# Patient Record
Sex: Female | Born: 2000 | Race: White | Hispanic: No | Marital: Single | Smoking: Never smoker
Health system: Southern US, Community
[De-identification: ages and names within clinical notes are randomized; demographics above are authoritative.]

## PROBLEM LIST (undated history)

## (undated) DIAGNOSIS — F419 Anxiety disorder, unspecified: Secondary | ICD-10-CM

## (undated) DIAGNOSIS — R7303 Prediabetes: Secondary | ICD-10-CM

## (undated) DIAGNOSIS — Z8669 Personal history of other diseases of the nervous system and sense organs: Secondary | ICD-10-CM

## (undated) DIAGNOSIS — F32A Depression, unspecified: Secondary | ICD-10-CM

## (undated) DIAGNOSIS — T7840XA Allergy, unspecified, initial encounter: Secondary | ICD-10-CM

## (undated) HISTORY — DX: Prediabetes: R73.03

## (undated) HISTORY — DX: Allergy, unspecified, initial encounter: T78.40XA

## (undated) HISTORY — DX: Anxiety disorder, unspecified: F41.9

## (undated) HISTORY — DX: Depression, unspecified: F32.A

## (undated) HISTORY — DX: Personal history of other diseases of the nervous system and sense organs: Z86.69

---
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
## 2022-02-18 ENCOUNTER — Telehealth: Payer: Managed Care, Other (non HMO)

## 2022-02-24 ENCOUNTER — Encounter: Payer: Self-pay | Admitting: Physician Assistant

## 2022-02-24 ENCOUNTER — Ambulatory Visit (INDEPENDENT_AMBULATORY_CARE_PROVIDER_SITE_OTHER): Payer: Managed Care, Other (non HMO) | Admitting: Physician Assistant

## 2022-02-24 VITALS — BP 128/86 | HR 100 | Temp 98.0°F | Ht 60.24 in | Wt 171.2 lb

## 2022-02-24 DIAGNOSIS — F419 Anxiety disorder, unspecified: Secondary | ICD-10-CM | POA: Diagnosis not present

## 2022-02-24 DIAGNOSIS — R7303 Prediabetes: Secondary | ICD-10-CM | POA: Insufficient documentation

## 2022-02-24 DIAGNOSIS — N920 Excessive and frequent menstruation with regular cycle: Secondary | ICD-10-CM | POA: Diagnosis not present

## 2022-02-24 DIAGNOSIS — F32A Depression, unspecified: Secondary | ICD-10-CM

## 2022-02-24 MED ORDER — HYDROXYZINE HCL 10 MG PO TABS: 10.0000 mg | ORAL_TABLET | Freq: Three times a day (TID) | ORAL | 0 refills | Status: DC | PRN

## 2022-02-24 NOTE — Assessment & Plan Note (Signed)
Most recent A1c last month was 5.7%.  She is working with her gynecologist.  Also gave her the name of nutritionist in town to reach out to.

## 2022-02-24 NOTE — Progress Notes (Signed)
Subjective:    Patient ID: Cindy Pope, female    DOB: 2001/03/26, 21 y.o.   MRN: [id number]  Chief Complaint  Patient presents with   New Patient (Initial Visit)    NP in office to establish care with PCP; pt has been having PCOS symptoms and was getting blood work completed, has first Pap scheduled in a few weeks; been having a really hard time with anxiety and depression, having panic attacks daily for past few months and starting to effect her not wanting to leave her home.     HPI 21 y.o. patient presents today for new patient establishment with me.  Patient was previously established with pediatrician.  Current Care Team: GYN - [place] Laws    Acute Concerns: Anxiety / depression worse - tried Better Help therapy, didn't seem to help much. 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 Middle school issues being bullied, transferred to a smaller school early college, graduated with 10 people- did better there.   Not sleeping well. Face gets warm and flushed often from anxiety. Uses a fan at night. Tosses and turns, restless, up every few hours. Tried Melatonin and some OTC vitamins, but this hasn't helped.  Chronic Concerns: See PMH listed below, as well as A/P for details on issues we specifically discussed during today's visit.      Past Medical History:  Diagnosis Date   Allergy Sep. 11th 2023   Caffine, break into hives and sweats if i drink to much of it.   Anxiety    Depression    Hx of migraines    Prediabetes     History reviewed. No pertinent surgical history.  Family History  Problem Relation Age of Onset   Anxiety disorder Mother    Depression Mother    Obesity Mother    Stroke Mother    Hypertension Mother    Anxiety disorder Father    Depression Father    Obesity Father    Diabetes Paternal Uncle    Diabetes Maternal Grandmother    Obesity Maternal Grandmother    Hearing loss Maternal  Grandfather    Obesity Maternal Grandfather    Cancer Maternal Grandfather    Diabetes Paternal Grandmother    Hearing loss Paternal Grandmother    Obesity Paternal Grandmother    Diabetes Paternal Grandfather    Hearing loss Paternal Grandfather    Obesity Paternal Grandfather     Social History   Tobacco Use   Smoking status: Never   Smokeless tobacco: Never  Vaping Use   Vaping Use: Former   Quit date: 12/10/2021  Substance Use Topics   Alcohol use: No   Drug use: Never     No Known Allergies  Review of Systems NEGATIVE UNLESS OTHERWISE INDICATED IN HPI      Objective:     BP 128/86 (BP Location: Right Arm)   Pulse 100   Temp 98 F (36.7 C) (Temporal)   Ht 5' 0.24" (1.53 m)   Wt 171 lb 3.2 oz (77.7 kg)   LMP 01/23/2022 (Exact Date)   SpO2 94%   BMI 33.17 kg/m   Wt Readings from Last 3 Encounters:  02/24/22 171 lb 3.2 oz (77.7 kg)    BP Readings from Last 3 Encounters:  02/24/22 128/86     Physical Exam Vitals and nursing note reviewed.  Constitutional:      Appearance: Normal appearance. She is obese. She is not toxic-appearing.  HENT:     Head: Normocephalic and atraumatic.  Eyes:     Extraocular Movements: Extraocular movements intact.     Conjunctiva/sclera: Conjunctivae normal.     Pupils: Pupils are equal, round, and reactive to light.  Cardiovascular:     Rate and Rhythm: Normal rate and regular rhythm.     Pulses: Normal pulses.     Heart sounds: Normal heart sounds.  Pulmonary:     Effort: Pulmonary effort is normal.     Breath sounds: Normal breath sounds.  Musculoskeletal:        General: Normal range of motion.     Cervical back: Normal range of motion and neck supple.  Skin:    General: Skin is warm and dry.  Neurological:     General: No focal deficit present.     Mental Status: She is alert and oriented to person, place, and time.  Psychiatric:        Mood and Affect: Mood is anxious. Affect is tearful.        Behavior:  Behavior normal.        Assessment & Plan:  Anxiety and depression Assessment & Plan:      02/24/2022    9:34 AM  PHQ9 SCORE ONLY  PHQ-9 Total Score 6    Worse this year after experiencing a break-up from a 4-year engagement.  I do think she would benefit from counseling at this time.  Referral was placed to see Dr. Carolynne Edouard at our office.  I will also have her start trying hydroxyzine for sleep and as needed for panic.  Possible side effects discussed with her.  I do think that the quality of sleep is really important for her and she is lacking in this right now.  Close follow-up with me in the next few weeks to see how she is doing.   Orders: -     Ambulatory referral to Psychology  Prediabetes Assessment & Plan: Most recent A1c last month was 5.7%.  She is working with her gynecologist.  Also gave her the name of nutritionist in town to reach out to.   Menorrhagia with regular cycle Assessment & Plan: Currently working with her gynecologist.   Other orders -     hydrOXYzine HCl; Take 1 tablet (10 mg total) by mouth 3 (three) times daily as needed for anxiety.  Dispense: 30 tablet; Refill: 0       Return in about 2 weeks (around 03/10/2022) for recheck.    Imre Vecchione M Michaelann Gunnoe, PA-C

## 2022-02-24 NOTE — Assessment & Plan Note (Signed)
      02/24/2022    9:34 AM  PHQ9 SCORE ONLY  PHQ-9 Total Score 6    Worse this year after experiencing a break-up from a 4-year engagement.  I do think she would benefit from counseling at this time.  Referral was placed to see Dr. Carolynne Edouard at our office.  I will also have her start trying hydroxyzine for sleep and as needed for panic.  Possible side effects discussed with her.  I do think that the quality of sleep is really important for her and she is lacking in this right now.  Close follow-up with me in the next few weeks to see how she is doing.

## 2022-02-24 NOTE — Patient Instructions (Addendum)
Welcome to [facility]! It was a pleasure meeting you today.  As discussed, Please schedule a 2 week follow up visit today.   Referral sent to Dr. Theodis Shove for counseling at our office. Call [phone] to schedule.  Try hydroxyzine tablets for anxiety / sleep. May take 1-2 tablets about 30-60 minutes prior to sleep. You may also take one tablet during the day for acute panic - caution drowsiness.   Please work on the following to help with sleep:  -Sleep only long enough to feel rested then get out of bed -Go to bed and get up at the same time every day. -Do not try to force yourself to sleep. If you can't sleep, get out of bed adn try again later. -Have coffee, tea, and other foods that have caffeine only in the morning. -Avoid alcohol -Keep your bedroom dark, cool, quiet, and free of reminders of work or other things that cause you stress -Exercise several days a week, but not right before bed -Avoid looking at phones or reading devices ("e-books") that give off light before bed. This can make it harder to fall asleep   PLEASE NOTE:  If you had any LAB tests please let us know if you have not heard back within a few days. You may see your results on MyChart before we have a chance to review them but we will give you a call once they are reviewed by [place]. If we ordered any REFERRALS today, please let us know if you have not heard from their office within the next two weeks. Let us know through MyChart if you are needing REFILLS, or have your pharmacy send [place] the request. You can also use MyChart to communicate with me or any office staff.  Please try these tips to maintain a healthy lifestyle:  Eat most of your calories during the day when you are active. Eliminate processed foods including packaged sweets (pies, cakes, cookies), reduce intake of potatoes, white bread, white pasta, and white rice. Look for whole grain options, oat flour or almond  flour.  Each meal should contain half fruits/vegetables, one quarter protein, and one quarter carbs (no bigger than a computer mouse).  Cut down on sweet beverages. This includes juice, soda, and sweet tea. Also watch fruit intake, though this is a healthier sweet option, it still contains natural sugar! Limit to 3 servings daily.  Drink at least 1 glass of water with each meal and aim for at least 8 glasses (64 ounces) per day.  Exercise at least 150 minutes every week to the best of your ability.    Take Care,  Lenor Provencher, PA-C

## 2022-02-24 NOTE — Assessment & Plan Note (Signed)
Currently working with her gynecologist.

## 2022-03-05 ENCOUNTER — Encounter: Payer: Self-pay | Admitting: Physician Assistant

## 2022-03-10 ENCOUNTER — Encounter: Payer: Self-pay | Admitting: Physician Assistant

## 2022-03-10 ENCOUNTER — Ambulatory Visit (INDEPENDENT_AMBULATORY_CARE_PROVIDER_SITE_OTHER): Payer: Managed Care, Other (non HMO) | Admitting: Physician Assistant

## 2022-03-10 VITALS — BP 115/75 | HR 82 | Temp 97.5°F | Ht 60.0 in | Wt 176.8 lb

## 2022-03-10 DIAGNOSIS — F32A Depression, unspecified: Secondary | ICD-10-CM

## 2022-03-10 DIAGNOSIS — F419 Anxiety disorder, unspecified: Secondary | ICD-10-CM

## 2022-03-10 DIAGNOSIS — E282 Polycystic ovarian syndrome: Secondary | ICD-10-CM | POA: Diagnosis not present

## 2022-03-10 MED ORDER — BUSPIRONE HCL 7.5 MG PO TABS: 7.5000 mg | ORAL_TABLET | Freq: Two times a day (BID) | ORAL | 0 refills | Status: AC

## 2022-03-10 MED ORDER — HYDROXYZINE HCL 10 MG PO TABS: 10.0000 mg | ORAL_TABLET | Freq: Every evening | ORAL | 2 refills | Status: DC | PRN

## 2022-03-10 NOTE — Progress Notes (Signed)
Subjective:    Patient ID: Cindy Pope, female    DOB: Sep 06, 2000, 21 y.o.   MRN: [id number]  Chief Complaint  Patient presents with   Follow-up    Pt in for 2 wk f/u for anxiety and depression; pt wanting to ensure result from ultrasound was received.     HPI Patient is in today for 2 week f/up. See A/P for details.   Past Medical History:  Diagnosis Date   Allergy Sep. 11th 2023   Caffine, break into hives and sweats if i drink to much of it.   Anxiety    Depression    Hx of migraines    Prediabetes     No past surgical history on file.  Family History  Problem Relation Age of Onset   Anxiety disorder Mother    Depression Mother    Obesity Mother    Stroke Mother    Hypertension Mother    Anxiety disorder Father    Depression Father    Obesity Father    Diabetes Paternal Uncle    Diabetes Maternal Grandmother    Obesity Maternal Grandmother    Hearing loss Maternal Grandfather    Obesity Maternal Grandfather    Cancer Maternal Grandfather    Diabetes Paternal Grandmother    Hearing loss Paternal Grandmother    Obesity Paternal Grandmother    Diabetes Paternal Grandfather    Hearing loss Paternal Grandfather    Obesity Paternal Grandfather     Social History   Tobacco Use   Smoking status: Never   Smokeless tobacco: Never  Vaping Use   Vaping Use: Former   Quit date: 12/10/2021  Substance Use Topics   Alcohol use: No   Drug use: Never     No Known Allergies  Review of Systems NEGATIVE UNLESS OTHERWISE INDICATED IN HPI      Objective:     BP 115/75 (BP Location: Left Arm)   Pulse 82   Temp (!) 97.5 F (36.4 C) (Temporal)   Ht 5' (1.524 m)   Wt 176 lb 12.8 oz (80.2 kg)   LMP 02/22/2022   SpO2 95%   BMI 34.53 kg/m   Wt Readings from Last 3 Encounters:  03/10/22 176 lb 12.8 oz (80.2 kg)  02/24/22 171 lb 3.2 oz (77.7 kg)    BP Readings from Last 3 Encounters:  03/10/22 115/75  02/24/22 128/86     Physical Exam Vitals and  nursing note reviewed.  Constitutional:      Appearance: Normal appearance. She is obese. She is not toxic-appearing.  HENT:     Head: Normocephalic and atraumatic.  Eyes:     Extraocular Movements: Extraocular movements intact.     Conjunctiva/sclera: Conjunctivae normal.     Pupils: Pupils are equal, round, and reactive to light.  Cardiovascular:     Rate and Rhythm: Normal rate and regular rhythm.     Pulses: Normal pulses.     Heart sounds: Normal heart sounds.  Pulmonary:     Effort: Pulmonary effort is normal.     Breath sounds: Normal breath sounds.  Musculoskeletal:        General: Normal range of motion.     Cervical back: Normal range of motion and neck supple.  Skin:    General: Skin is warm and dry.  Neurological:     General: No focal deficit present.     Mental Status: She is alert and oriented to person, place, and time.  Psychiatric:  Mood and Affect: Mood and affect normal. Mood is not anxious. Affect is not tearful.        Assessment & Plan:  PCOS (polycystic ovarian syndrome) Assessment & Plan: New diagnosis 02/2022 with [facility] Started on Metformin 500 mg BID and OCPs with [facility]  Looked into nutritionist in [place], but she was too costly and didn't take insurance [place] new referral to nutritionist     Anxiety and depression Assessment & Plan: Sleeping better when using hydroxyzine for sleep; made her too drowsy when taking as needed for panic. Still getting anxious when going out with friends / out in a crowd. First appt with Dr. Theodis Shove 12/13. May try buspar 7.5 mg BID if desired to help with daytime anxiousness. Pt aware of risks vs benefits and possible adverse reactions. She might hold off until first appt with [facility]; will let me know what she decides.  Overall seeing improvement and this is great news    Other orders -     busPIRone HCl; Take 1 tablet (7.5 mg total) by mouth 2 (two) times daily.  Dispense: 60 tablet; Refill:  0 -     hydrOXYzine HCl; Take 1 tablet (10 mg total) by mouth at bedtime as needed for anxiety.  Dispense: 30 tablet; Refill: 2       Return in about 3 months (around 06/10/2022) for med recheck .  This note was prepared with assistance of Systems analyst. Occasional wrong-word or sound-a-like substitutions may have occurred due to the inherent limitations of voice recognition software.     Cordera Stineman M Jemmie Rhinehart, PA-C

## 2022-03-10 NOTE — Assessment & Plan Note (Addendum)
New diagnosis 02/2022 with [facility] Started on Metformin 500 mg BID and OCPs with [facility]  Looked into nutritionist in [place], but she was too costly and didn't take insurance [facility] new referral to nutritionist

## 2022-03-10 NOTE — Assessment & Plan Note (Signed)
Sleeping better when using hydroxyzine for sleep; made her too drowsy when taking as needed for panic. Still getting anxious when going out with friends / out in a crowd. First appt with Dr. Theodis Shove 12/13. May try buspar 7.5 mg BID if desired to help with daytime anxiousness. Pt aware of risks vs benefits and possible adverse reactions. She might hold off until first appt with [facility]; will let me know what she decides.  Overall seeing improvement and this is great news

## 2022-04-01 ENCOUNTER — Encounter: Payer: Self-pay | Admitting: Internal Medicine

## 2022-04-01 ENCOUNTER — Ambulatory Visit (INDEPENDENT_AMBULATORY_CARE_PROVIDER_SITE_OTHER): Payer: Managed Care, Other (non HMO) | Admitting: Internal Medicine

## 2022-04-01 VITALS — BP 132/87 | HR 96 | Temp 98.2°F | Resp 14 | Ht 60.0 in | Wt 168.6 lb

## 2022-04-01 DIAGNOSIS — J45909 Unspecified asthma, uncomplicated: Secondary | ICD-10-CM | POA: Diagnosis not present

## 2022-04-01 DIAGNOSIS — F32A Depression, unspecified: Secondary | ICD-10-CM

## 2022-04-01 DIAGNOSIS — F419 Anxiety disorder, unspecified: Secondary | ICD-10-CM

## 2022-04-01 DIAGNOSIS — E669 Obesity, unspecified: Secondary | ICD-10-CM

## 2022-04-01 DIAGNOSIS — T50905A Adverse effect of unspecified drugs, medicaments and biological substances, initial encounter: Secondary | ICD-10-CM

## 2022-04-01 HISTORY — DX: Unspecified asthma, uncomplicated: J45.909

## 2022-04-01 MED ORDER — SEMAGLUTIDE-WEIGHT MANAGEMENT 1.7 MG/0.75ML ~~LOC~~ SOAJ: 1.7000 mg | SUBCUTANEOUS | 0 refills | Status: DC

## 2022-04-01 MED ORDER — SEMAGLUTIDE-WEIGHT MANAGEMENT 0.5 MG/0.5ML ~~LOC~~ SOAJ: 0.5000 mg | SUBCUTANEOUS | 0 refills | Status: DC

## 2022-04-01 MED ORDER — FLUOXETINE HCL 20 MG PO TABS: 20.0000 mg | ORAL_TABLET | Freq: Every day | ORAL | 3 refills | Status: DC

## 2022-04-01 MED ORDER — SEMAGLUTIDE-WEIGHT MANAGEMENT 0.25 MG/0.5ML ~~LOC~~ SOAJ: 0.2500 mg | SUBCUTANEOUS | 0 refills | Status: DC

## 2022-04-01 MED ORDER — ALBUTEROL SULFATE HFA 108 (90 BASE) MCG/ACT IN AERS: 2.0000 | INHALATION_SPRAY | Freq: Four times a day (QID) | RESPIRATORY_TRACT | 0 refills | Status: DC | PRN

## 2022-04-01 MED ORDER — SEMAGLUTIDE-WEIGHT MANAGEMENT 2.4 MG/0.75ML ~~LOC~~ SOAJ: 2.4000 mg | SUBCUTANEOUS | 0 refills | Status: DC

## 2022-04-01 MED ORDER — SEMAGLUTIDE-WEIGHT MANAGEMENT 1 MG/0.5ML ~~LOC~~ SOAJ: 1.0000 mg | SUBCUTANEOUS | 0 refills | Status: DC

## 2022-04-01 NOTE — Assessment & Plan Note (Signed)
To hydroxyzine Use inhaler with or try benadryl instead

## 2022-04-01 NOTE — Progress Notes (Signed)
Adult nurse [facility]:  [phone]   Routine Medical Office Visit  Patient:  Cindy Pope      Age: 21 y.o.       Sex:  female  Date:   04/01/2022  PCP:    Bary Leriche, PA-C    Today's Healthcare Provider: Lula Olszewski, MD  Assessment/Plan:   Cindy Pope was seen today for discuss medication issues.  Anxiety and depression Assessment & Plan: Her mother has response to fluoxetine and Wellbutrin so I will try to add fluoxetine on, I encouraged her to try to also push through the BuSpar side effects and keep taking it but I am also okay with that if she stops that when if the dizziness is too much but I do think it will improve after the first few days of taking it if she can hang in there may be retried at half tablet.  For the fluoxetine the 20 mg dose is the lowest and so I encouraged her to also try that it just half a tablet and take it consistently or there will be withdrawal.  For the hydroxyzine it sounds like she is may be having a mild allergy to it but that is unexpected since it is actually an antiallergy medicine so I encouraged her to limit the use but not be too afraid to take it if she needs to knock herself out.  I also talked about mindfulness as a strategy to get out of negative thoughts spirals and gave the example of breathing exercises but encouraged her to read more about it and gave a handout.  I also told her about [facility] which would enable her to get therapy sooner than the planned December date.  I also offered psychiatry referral although I do not feel like that will ultimately be necessary if she can get good therapy.  I think the best therapy that she can get right now would be grief and support counseling with a support group and so I showed her how to look those up online.  She denies ever having any suicidal thoughts but she is quite tearful,   I discussed possibly starting Abilify or perhaps another medication for refractory depression and  anxiety because she was so tearful in the exam but I think it would interfere with her weight loss goals so instead I am going to try to get [facility] for her and I think that might go a long ways even to treat her emotional distress just feeling better with weight loss.  Orders: -     FLUoxetine HCl; Take 1 tablet (20 mg total) by mouth daily.  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  Dispense: 2 mL; Refill: 0 -     Semaglutide-Weight Management; Inject 0.5 mg into the skin once a week for 28 days.  Dispense: 2 mL; Refill: 0 -     Semaglutide-Weight Management; Inject 1 mg into the skin once a week for 28 days.  Dispense: 2 mL; Refill: 0 -     Semaglutide-Weight Management; Inject 1.7 mg into the skin once a week for 28 days.  Dispense: 3 mL; Refill: 0 -     Semaglutide-Weight Management; Inject 2.4 mg into the skin once a week for 28 days.  Dispense: 3 mL; Refill: 0  Drug-induced asthma Assessment & Plan: To hydroxyzine Use inhaler  with or try benadryl instead  Orders: -     Albuterol Sulfate HFA; Inhale 2 puffs into the lungs every 6 (six) hours as needed for wheezing or shortness of breath.  Dispense: 8 g; Refill: 0     Return in about 4 weeks (around 04/29/2022).   Today's key discussion points summarized and made available in the After Visit Summary (AVS) Common side effects, risks, benefits, and alternatives for medications and treatment plan prescribed today were discussed, and she expressed understanding of the given instructions.  She was encouraged to contact our office by phone or message via MyChart if she has any questions or concerns regarding our treatment plan (see AVS).  Medication list was reconciled and patient instructions and summary information was documented and made available for her to review in the AVS (see AVS).  This note is also available to patient  for review for accuracy and understanding. We discussed red flag symptoms and signs in detail and when to call the office or go to ER if her condition worsens (see AFTER VISIT SUMMARY). She expressed understanding.  No barriers to understanding were identified - go to ER if suicidal    Subjective:   Cindy Pope is a 21 y.o. female with past medical history including: Past Medical History:  Diagnosis Date   Allergy Sep. 11th 2023   Caffine, break into hives and sweats if i drink to much of it.   Anxiety    Depression    Drug-induced asthma 04/01/2022   Hx of migraines    Prediabetes      She is presenting today with: Chief Complaint  Patient presents with   Discuss medication issues    Hydroxyzine mainly and buspirone (makes her feel dizzy). Fells as if she is having a constant ashtma attack with the hydroxyzine, stopped taking it for about two days and felt better. Requests to be prescribed other medications to replace both.     Problem focused charting was used to record today's medical interview as follows:  Buspar was a "backup" from alyssa for anxiety during the day- but first 2d.  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   That trauma occurred 8 months ago.    Doesn't feel socially supported by friends/family    Has an appointment with a counselor here in December Hydroxyzine causes ashtma Recent trip with friend was helpful she visited 4-5 days          Objective:  Physical Exam: BP 132/87 (BP Location: Left Arm, Patient Position: Sitting)   Pulse 96   Temp 98.2 F (36.8 C) (Temporal)   Resp 14   Ht 5' (1.524 m)   Wt 168 lb 9.6 oz (76.5 kg)   LMP 02/22/2022   SpO2 97%   BMI 32.93 kg/m   Problem-specific physical exam  findings:  Physical Exam Vitals and nursing note reviewed.  Constitutional:      General: She is awake. She is not in acute distress.    Appearance: Normal appearance. She is well-developed. She is not ill-appearing, toxic-appearing or diaphoretic.  HENT:     Head: Normocephalic and atraumatic.     Nose: Nose normal.  Eyes:     General: Lids are  normal. Vision grossly intact. No scleral icterus.       Right eye: No discharge.        Left eye: No discharge.     Conjunctiva/sclera: Conjunctivae normal.  Pulmonary:     Effort: Pulmonary effort is normal. No accessory muscle usage or respiratory distress.  Neurological:     General: No focal deficit present.     Mental Status: She is alert.  Psychiatric:        Attention and Perception: Perception normal. She is attentive. She does not perceive auditory hallucinations.        Mood and Affect: Mood is anxious and depressed. Mood is not elated. Affect is labile and tearful.        Speech: She is communicative. Speech is not rapid and pressured, delayed, slurred or tangential.        Behavior: Behavior normal. Behavior is not agitated, slowed, aggressive, withdrawn, hyperactive or combative.        Thought Content: Thought content is not paranoid. Thought content does not include suicidal ideation. Thought content does not include suicidal plan.        Cognition and Memory: Cognition is not impaired. Memory is not impaired. She does not exhibit impaired recent memory or impaired remote memory.        Judgment: Judgment normal. Judgment is not impulsive or inappropriate.       Results Reviewed:  No results found for any visits on 04/01/22.   No results found for this or any previous visit (from the past 2160 hour(s)).

## 2022-04-01 NOTE — Patient Instructions (Addendum)
It was a pleasure seeing you today! I truly hope you feel like you received 5 star service and please let me know if there is anything I can improve.  Lula Olszewski, MD   Today the plan is...  Anxiety and depression Assessment & Plan: Her mother has response to fluoxetine and Wellbutrin so I will try to add fluoxetine on, I encouraged her to try to also push through the BuSpar side effects and keep taking it but I am also okay with that if she stops that when if the dizziness is too much but I do think it will improve after the first few days of taking it if she can hang in there may be retried at half tablet.  For the fluoxetine the 20 mg dose is the lowest and so I encouraged her to also try that it just half a tablet and take it consistently or there will be withdrawal.  For the hydroxyzine it sounds like she is may be having a mild allergy to it but that is unexpected since it is actually an antiallergy medicine so I encouraged her to limit the use but not be too afraid to take it if she needs to knock herself out.  I also talked about mindfulness as a strategy to get out of negative thoughts spirals and gave the example of breathing exercises but encouraged her to read more about it and gave a handout.  I also told her about Po which would enable her to get therapy sooner than the planned December date.  I also offered psychiatry referral although I do not feel like that will ultimately be necessary if she can get good therapy.  I think the best therapy that she can get right now would be grief and support counseling with a support group and so I showed her how to look those up online.  She denies ever having any suicidal thoughts but she is quite tearful,   I discussed possibly starting Abilify or perhaps another medication for refractory depression and anxiety because she was so tearful in the exam but I think it would interfere with her weight loss goals so instead I am going to try to get  [facility] for her and I think that might go a long ways even to treat her emotional distress just feeling better with weight loss.  Orders: -     FLUoxetine HCl; Take 1 tablet (20 mg total) by mouth daily.  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  Dispense: 2 mL; Refill: 0 -     Semaglutide-Weight Management; Inject 0.5 mg into the skin once a week for 28 days.  Dispense: 2 mL; Refill: 0 -     Semaglutide-Weight Management; Inject 1 mg into the skin once a week for 28 days.  Dispense: 2 mL; Refill: 0 -     Semaglutide-Weight Management; Inject 1.7 mg into the skin once a week for 28 days.  Dispense: 3 mL; Refill: 0 -     Semaglutide-Weight Management; Inject 2.4 mg into the skin once a week for 28 days.  Dispense: 3 mL; Refill: 0  Drug-induced asthma Assessment & Plan: To hydroxyzine Use inhaler with or try benadryl instead  Orders: -     Albuterol Sulfate HFA; Inhale 2 puffs into the lungs every 6 (six) hours as needed for wheezing or shortness  of breath.  Dispense: 8 g; Refill: 0         [x]  RETURN TO CLINIC: Return in about 4 weeks (around 04/29/2022).   - If you are not doing well: RETURN to the office sooner. - Please bring all your medicines to each appointment.  - If your condition begins to worsen or become severe:  GO to the ER.

## 2022-04-01 NOTE — Assessment & Plan Note (Signed)
Her mother has response to fluoxetine and Wellbutrin so I will try to add fluoxetine on, I encouraged her to try to also push through the BuSpar side effects and keep taking it but I am also okay with that if she stops that when if the dizziness is too much but I do think it will improve after the first few days of taking it if she can hang in there may be retried at half tablet.  For the fluoxetine the 20 mg dose is the lowest and so I encouraged her to also try that it just half a tablet and take it consistently or there will be withdrawal.  For the hydroxyzine it sounds like she is may be having a mild allergy to it but that is unexpected since it is actually an antiallergy medicine so I encouraged her to limit the use but not be too afraid to take it if she needs to knock herself out.  I also talked about mindfulness as a strategy to get out of negative thoughts spirals and gave the example of breathing exercises but encouraged her to read more about it and gave a handout.  I also told her about Po which would enable her to get therapy sooner than the planned December date.  I also offered psychiatry referral although I do not feel like that will ultimately be necessary if she can get good therapy.  I think the best therapy that she can get right now would be grief and support counseling with a support group and so I showed her how to look those up online.  She denies ever having any suicidal thoughts but she is quite tearful,   I discussed possibly starting Abilify or perhaps another medication for refractory depression and anxiety because she was so tearful in the exam but I think it would interfere with her weight loss goals so instead I am going to try to get [facility] for her and I think that might go a long ways even to treat her emotional distress just feeling better with weight loss.

## 2022-04-02 ENCOUNTER — Telehealth: Payer: Self-pay | Admitting: Physician Assistant

## 2022-04-02 NOTE — Telephone Encounter (Signed)
Patient Name: Cindy Pope Gender: Female DOB: April 11, 2001 Age: 21 Y 7 M 27 D Return Phone Number: [phone] (Primary), [phone] (Secondary) Address: City/ State/ Zip: [place] [address] Client [facility] - Administrator, sports at [facility] Insurance claims handler, Media planner- PA Contact Type Call Who Is Calling Patient / Member / Family / Caregiver Call Type Triage / Clinical Relationship To Patient Self Return Phone Number [phone] (Primary) Chief Complaint CHEST PAIN - pain, pressure, heaviness or tightness Reason for Call Symptomatic / Request for Health Information Initial Comment Office transferring caller. Caller states she is having chest pain. Her medication was changed to Prozac yesterday. She isn't sure that is what it is related too or not. Chest pains started yesterday and she was having some issues breathing also. Today she still has chest pains and having to catch her breath off and on. She also had some dizziness. Translation No Nurse Assessment Nurse: Surprenant, RN, Monique Date/Time (Eastern Time): 04/02/2022 12:00:38 PM Confirm and document reason for call. If symptomatic, describe symptoms. ---Caller states she is having chest pain. Her medication was changed to Prozac yesterday. When she took last night "vomited all night and had diarrhea" States BP is "fine" Chest pain started at 4am. She isn't sure that is what it is related too or not. Able to stand on own but after a little while needs to sit down. Having heard time catching breath or taking deep breath. Has never been on Prozac before. Does the patient have any new or worsening symptoms? ---Yes Will a triage be completed? ---Yes Related visit to physician within the last 2 weeks? ---Yes Does the PT have any chronic conditions? (i.e. diabetes, asthma, this includes High risk factors for pregnancy, etc.) ---Yes List chronic  conditions. ---Taking prozac for anxiety Is the patient pregnant or possibly pregnant? (Ask all females between the ages of 38-55) ---No Is this a behavioral health or substance abuse call? ---No  Nurse Assessment Guidelines Guideline Title Affirmed Question Affirmed Notes Nurse Date/Time (Eastern Time) Chest Pain [1] Chest pain lasts > 5 minutes AND [2] described as crushing, pressure-like, or heavy Surprenant, RN, Monique 04/02/2022 12:04:04 PM Disp. Time Lamount Cohen Time) Disposition Final User 04/02/2022 11:59:28 AM Send to Urgent Corliss Blacker 04/02/2022 12:07:43 PM Call EMS 911 Now Yes Surprenant, RN, Monique 04/02/2022 12:10:35 PM 911 Outcome Documentation Surprenant, RN, Gabriel Rung Reason: Caller refused at this time. Final Disposition 04/02/2022 12:07:43 PM Call EMS 911 Now Yes Surprenant, RN, Gabriel Rung Caller Disagree/Comply Disagree Caller Understands Yes PreDisposition Did not know what to do Care Advice Given Per Guideline CALL EMS 911 NOW: * Immediate medical attention is needed. You need to hang up and call 911 (or an ambulance). * Triager Discretion: I'll call you back in a few minutes to be sure you were able to reach them. Comments User: Lawson Radar, RN Date/Time Lamount Cohen Time): 04/02/2022 12:09:16 PM Caller states " I am going to call my mom first to see how fast she can get here and then my boss" Caller not calling 911 at this time. "If after I talk to my mom I need to call 911 than I will" Referrals GO TO FACILITY UNDECIDED

## 2022-04-02 NOTE — Telephone Encounter (Signed)
Pt States: -complain chest pain -just started Prozac  Patient warm transferred to [facility], patient coordinator with PCP Triage / Team Health Nurse

## 2022-04-02 NOTE — Telephone Encounter (Signed)
Please see pt call note, transferred to triage nurse

## 2022-04-02 NOTE — Telephone Encounter (Signed)
Please see triage note patient was advised to call 911 now

## 2022-04-02 NOTE — Telephone Encounter (Signed)
Noted and agreed, thank you. 

## 2022-04-05 NOTE — Telephone Encounter (Signed)
Noted and agreed, thank you. 

## 2022-04-23 ENCOUNTER — Ambulatory Visit (INDEPENDENT_AMBULATORY_CARE_PROVIDER_SITE_OTHER): Payer: 59 | Admitting: Behavioral Health

## 2022-04-23 ENCOUNTER — Other Ambulatory Visit: Payer: Self-pay | Admitting: Internal Medicine

## 2022-04-23 ENCOUNTER — Other Ambulatory Visit: Payer: Self-pay

## 2022-04-23 DIAGNOSIS — F32A Depression, unspecified: Secondary | ICD-10-CM | POA: Diagnosis not present

## 2022-04-23 DIAGNOSIS — J45909 Unspecified asthma, uncomplicated: Secondary | ICD-10-CM

## 2022-04-23 DIAGNOSIS — F419 Anxiety disorder, unspecified: Secondary | ICD-10-CM | POA: Diagnosis not present

## 2022-04-23 NOTE — Telephone Encounter (Signed)
This has been taken care of, sent in with 2 refills.
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
## 2022-04-23 NOTE — Progress Notes (Signed)
                Tiffine Henigan L Kerry-Anne Mezo, LMFT 

## 2022-04-24 ENCOUNTER — Encounter: Payer: Self-pay | Admitting: Physician Assistant

## 2022-04-24 ENCOUNTER — Ambulatory Visit: Payer: Managed Care, Other (non HMO) | Admitting: Physician Assistant

## 2022-04-24 VITALS — BP 134/86 | HR 90 | Temp 97.5°F | Ht 60.0 in | Wt 161.2 lb

## 2022-04-24 DIAGNOSIS — F419 Anxiety disorder, unspecified: Secondary | ICD-10-CM

## 2022-04-24 DIAGNOSIS — Z23 Encounter for immunization: Secondary | ICD-10-CM

## 2022-04-24 DIAGNOSIS — F32A Depression, unspecified: Secondary | ICD-10-CM | POA: Diagnosis not present

## 2022-04-24 MED ORDER — PROPRANOLOL HCL 10 MG PO TABS: ORAL_TABLET | ORAL | 0 refills | Status: DC

## 2022-04-24 NOTE — Progress Notes (Signed)
Subjective:    Patient ID: Cindy Pope, female    DOB: October 25, 2000, 21 y.o.   MRN: [id number]  Chief Complaint  Patient presents with   Follow-up    Fluoxetine has been great since getting past side effects, still having a little SOB at times, slight headache at times; but pt is happy with results so far.     HPI Patient is in today for medication follow-up on anxiety and depression. She has been taking Fluoxetine 20 mg 1/2 tab x 3 weeks. Feels some SOB, some flushing, at times since starting the medication, but overall very helpful so far. Less anxious / over-thinking during the day, more calm. She was able to get out to an outlet store and this was big improvement for her. Seeing Dr. Monna Fam, counseling.  Sleeping is good, no longer needing hydroxyzine.  No longer eating fast food / no binge eating anymore. Working on [employer] / weight loss.  Past Medical History:  Diagnosis Date   Allergy Sep. 11th 2023   Caffine, break into hives and sweats if i drink to much of it.   Anxiety    Depression    Drug-induced asthma 04/01/2022   Hx of migraines    Prediabetes     History reviewed. No pertinent surgical history.  Family History  Problem Relation Age of Onset   Anxiety disorder Mother    Depression Mother    Obesity Mother    Stroke Mother    Hypertension Mother    Anxiety disorder Father    Depression Father    Obesity Father    Diabetes Paternal Uncle    Diabetes Maternal Grandmother    Obesity Maternal Grandmother    Hearing loss Maternal Grandfather    Obesity Maternal Grandfather    Cancer Maternal Grandfather    Diabetes Paternal Grandmother    Hearing loss Paternal Grandmother    Obesity Paternal Grandmother    Diabetes Paternal Grandfather    Hearing loss Paternal Grandfather    Obesity Paternal Grandfather     Social History   Tobacco Use   Smoking status: Never   Smokeless tobacco: Never  Vaping Use   Vaping Use: Former   Quit date: 12/10/2021   Substance Use Topics   Alcohol use: No   Drug use: Never     No Known Allergies  Review of Systems NEGATIVE UNLESS OTHERWISE INDICATED IN HPI      Objective:     BP 134/86 (BP Location: Right Arm) Comment (BP Location): manually  Pulse 90   Temp (!) 97.5 F (36.4 C) (Temporal)   Ht 5' (1.524 m)   Wt 161 lb 3.2 oz (73.1 kg)   LMP 03/23/2022 (Approximate)   SpO2 99%   BMI 31.48 kg/m   Wt Readings from Last 3 Encounters:  04/24/22 161 lb 3.2 oz (73.1 kg)  04/01/22 168 lb 9.6 oz (76.5 kg)  03/10/22 176 lb 12.8 oz (80.2 kg)    BP Readings from Last 3 Encounters:  04/24/22 134/86  04/01/22 132/87  03/10/22 115/75     Physical Exam Vitals reviewed.  Constitutional:      Appearance: Normal appearance.  Cardiovascular:     Rate and Rhythm: Normal rate and regular rhythm.     Pulses: Normal pulses.     Heart sounds: No murmur heard. Pulmonary:     Effort: Pulmonary effort is normal.     Breath sounds: Normal breath sounds.  Neurological:     General: No focal  deficit present.     Mental Status: She is alert and oriented to person, place, and time.  Psychiatric:        Mood and Affect: Mood normal.        Behavior: Behavior normal.        Assessment & Plan:  Anxiety and depression Assessment & Plan: Seeing improvement with the addition of Prozac 20 mg once daily.  She is still having some episodes of panic, especially prior to going out in public or to appointments.  Plan to trial propranolol 10 mg about 30 to 60 minutes prior to these events to see if this will help with her acute anxiety.  Plan to give the Prozac more time to work and consider increasing the next 4 weeks if needed.   Need for HPV vaccine -     HPV 9-valent vaccine,Recombinat  Other orders -     Propranolol HCl; Take one tab po about 30 minutes prior to anxiety-provoking event.  Dispense: 15 tablet; Refill: 0      Return in about 4 weeks (around 05/22/2022) for recheck .  This note  was prepared with assistance of Conservation officer, historic buildings. Occasional wrong-word or sound-a-like substitutions may have occurred due to the inherent limitations of voice recognition software.    Nelva Hauk M Yeily Link, PA-C

## 2022-04-25 NOTE — Assessment & Plan Note (Signed)
Seeing improvement with the addition of Prozac 20 mg once daily.  She is still having some episodes of panic, especially prior to going out in public or to appointments.  Plan to trial propranolol 10 mg about 30 to 60 minutes prior to these events to see if this will help with her acute anxiety.  Plan to give the Prozac more time to work and consider increasing the next 4 weeks if needed.

## 2022-05-21 ENCOUNTER — Encounter: Payer: Self-pay | Admitting: Physician Assistant

## 2022-05-21 ENCOUNTER — Ambulatory Visit: Payer: 59 | Admitting: Behavioral Health

## 2022-05-21 ENCOUNTER — Telehealth: Payer: Managed Care, Other (non HMO) | Admitting: Physician Assistant

## 2022-05-21 VITALS — Ht 60.0 in | Wt 161.0 lb

## 2022-05-21 DIAGNOSIS — F32A Depression, unspecified: Secondary | ICD-10-CM

## 2022-05-21 DIAGNOSIS — J069 Acute upper respiratory infection, unspecified: Secondary | ICD-10-CM

## 2022-05-21 DIAGNOSIS — F419 Anxiety disorder, unspecified: Secondary | ICD-10-CM

## 2022-05-21 MED ORDER — BENZONATATE 100 MG PO CAPS: 100.0000 mg | ORAL_CAPSULE | Freq: Three times a day (TID) | ORAL | 0 refills | Status: DC | PRN

## 2022-05-21 MED ORDER — PROMETHAZINE-DM 6.25-15 MG/5ML PO SYRP: 5.0000 mL | ORAL_SOLUTION | Freq: Every evening | ORAL | 0 refills | Status: DC | PRN

## 2022-05-21 MED ORDER — PREDNISONE 20 MG PO TABS: 40.0000 mg | ORAL_TABLET | Freq: Every day | ORAL | 0 refills | Status: AC

## 2022-05-21 NOTE — Progress Notes (Unsigned)
                Toney Difatta L Octavious Zidek, LMFT 

## 2022-05-21 NOTE — Progress Notes (Signed)
   Virtual Visit via Video Note  I connected with  Cindy Pope  on 05/21/22 at 12:30 PM EST by a video enabled telemedicine application and verified that I am speaking with the correct person using two identifiers.  Location: Patient: home Provider: Therapist, music at [facility] present: Patient and myself   I discussed the limitations of evaluation and management by telemedicine and the availability of in person appointments. The patient expressed understanding and agreed to proceed.   History of Present Illness:  22 yo female presents for VV to f/up on anxiety, depression, panic. One panic attack since last visit, otherwise overall feeling much better in regard to mental health. She was heading back to work after extended time off due to [place]. Still having quite a bit of cough and congestion. No fever or chills. No SOB or other symptoms at this time.    Observations/Objective:   Gen: Awake, alert, no acute distress, congested sounding Resp: Breathing is even and non-labored; coughing Psych: calm/pleasant demeanor Neuro: Alert and Oriented x 3, + facial symmetry, speech is clear.   Assessment and Plan:  1. Anxiety and depression Glad to see her improving Continue Prozac 20 mg daily Continue Inderal 10 mg prn panic / anxiety Keep working on natural solutions F/up 4 months or prn  2. URI with cough and congestion S/p COVID-19  Rx prednisone 20 mg, promethazine DM, tessalon perles for added relief of symptoms Recheck in person if worse or not improving    Follow Up Instructions:    I discussed the assessment and treatment plan with the patient. The patient was provided an opportunity to ask questions and all were answered. The patient agreed with the plan and demonstrated an understanding of the instructions.   The patient was advised to call back or seek an in-person evaluation if the symptoms worsen or if the condition fails to improve as  anticipated.  Zaine Elsass M Britney Captain, PA-C

## 2022-05-29 ENCOUNTER — Ambulatory Visit (INDEPENDENT_AMBULATORY_CARE_PROVIDER_SITE_OTHER): Payer: 59 | Admitting: Behavioral Health

## 2022-05-29 DIAGNOSIS — F419 Anxiety disorder, unspecified: Secondary | ICD-10-CM

## 2022-05-29 DIAGNOSIS — F32A Depression, unspecified: Secondary | ICD-10-CM | POA: Diagnosis not present

## 2022-05-29 NOTE — Progress Notes (Signed)
                Zayana Salvador L Carmela Piechowski, LMFT 

## 2022-05-29 NOTE — Progress Notes (Signed)
[place] Counselor/Therapist Progress Note  Patient ID: Cindy Pope, MRN: [id number],    Date: 05/29/2022  Time Spent: 57 min In Person @ [facility] Office   Treatment Type: Individual Therapy  Reported Symptoms: Reduction in anx/dep & stress. Pt reports she is dating an old friend from [place] for about 3 wks now. She is feeling positive about the relationship.  Pt was told today her Boss died. It has been difficult to hear this news.   Mental Status Exam: Appearance:  Neat and Well Groomed     Behavior: Appropriate, Sharing, and excited  Motor: Normal  Speech/Language:  Clear and Coherent and Normal Rate  Affect: Appropriate  Mood: normal  Thought process: normal  Thought content:   WNL  Sensory/Perceptual disturbances:   WNL  Orientation: oriented to person, place, and time/date  Attention: Good  Concentration: Good  Memory: WNL  Fund of knowledge:  Good  Insight:   Good  Judgment:  Good  Impulse Control: Good   Risk Assessment: Danger to Self:  No Self-injurious Behavior: No Danger to Others: No Duty to Warn:no Physical Aggression / Violence:No  Access to Firearms a concern: No  Gang Involvement:No   Subjective: Pt is in relatively positive place today reporting on the news of her Cindy Pope' death last evening. She is saddened by the loss of this dear Colleague who has supported her work in the [employer] for years.  Pt is recovered from episode of COVID-19 over Christmas. Her entire Family was sick; Parents & Gparents alike.   Pt is happy for her reunion w/a friend from 9th Grade whom she is now dating. He has changed & grown into a respectable man.   Interventions: Solution-Oriented/Positive Psychology  Diagnosis:Anxiety and depression  Plan: Cindy Pope will review her goals for psychotherapy & come w/a revised plan next session.   Target Date: 06/19/2022  Progress: 8  Frequency: Once every 3 wks  Modality: Cindy Reaper, LMFT

## 2022-06-10 ENCOUNTER — Ambulatory Visit: Payer: Managed Care, Other (non HMO) | Admitting: Physician Assistant

## 2022-06-19 ENCOUNTER — Ambulatory Visit: Payer: 59 | Admitting: Behavioral Health

## 2022-07-16 ENCOUNTER — Telehealth: Payer: Managed Care, Other (non HMO) | Admitting: Nurse Practitioner

## 2022-07-16 DIAGNOSIS — N3000 Acute cystitis without hematuria: Secondary | ICD-10-CM | POA: Diagnosis not present

## 2022-07-16 MED ORDER — CEPHALEXIN 500 MG PO CAPS: 500.0000 mg | ORAL_CAPSULE | Freq: Two times a day (BID) | ORAL | 0 refills | Status: AC

## 2022-07-16 NOTE — Progress Notes (Signed)
E-Visit for Urinary Problems  We are sorry that you are not feeling well.  Here is how we plan to help!  Based on what you shared with me it looks like you most likely have a simple urinary tract infection.  A UTI (Urinary Tract Infection) is a bacterial infection of the bladder.  Most cases of urinary tract infections are simple to treat but a key part of your care is to encourage you to drink plenty of fluids and watch your symptoms carefully.  I have prescribed Keflex 500 mg twice a day for 7 days.  Your symptoms should gradually improve. Call us if the burning in your urine worsens, you develop worsening fever, back pain or pelvic pain or if your symptoms do not resolve after completing the antibiotic.  Urinary tract infections can be prevented by drinking plenty of water to keep your body hydrated.  Also be sure when you wipe, wipe from front to back and don't hold it in!  If possible, empty your bladder every 4 hours.  HOME CARE Drink plenty of fluids Compete the full course of the antibiotics even if the symptoms resolve Remember, when you need to go.go. Holding in your urine can increase the likelihood of getting a UTI! GET HELP RIGHT AWAY IF: You cannot urinate You get a high fever Worsening back pain occurs You see blood in your urine You feel sick to your stomach or throw up You feel like you are going to pass out  MAKE SURE YOU  Understand these instructions. Will watch your condition. Will get help right away if you are not doing well or get worse.   Thank you for choosing an e-visit.  Your e-visit answers were reviewed by a board certified advanced clinical practitioner to complete your personal care plan. Depending upon the condition, your plan could have included both over the counter or prescription medications.  Please review your pharmacy choice. Make sure the pharmacy is open so you can pick up prescription now. If there is a problem, you may contact your  provider through [employer] and have the prescription routed to another pharmacy.  Your safety is important to [place]. If you have drug allergies check your prescription carefully.   For the next 24 hours you can use MyChart to ask questions about today's visit, request a non-urgent call back, or ask for a work or school excuse. You will get an email in the next two days asking about your experience. I hope that your e-visit has been valuable and will speed your recovery.   Meds ordered this encounter  Medications   cephALEXin (KEFLEX) 500 MG capsule    Sig: Take 1 capsule (500 mg total) by mouth 2 (two) times daily for 7 days.    Dispense:  14 capsule    Refill:  0     I spent approximately 5 minutes reviewing the patient's history, current symptoms and coordinating their care today.

## 2022-09-05 ENCOUNTER — Ambulatory Visit: Payer: Managed Care, Other (non HMO) | Admitting: Physician Assistant

## 2022-10-23 ENCOUNTER — Telehealth: Payer: Managed Care, Other (non HMO) | Admitting: Family Medicine

## 2022-10-23 DIAGNOSIS — H6992 Unspecified Eustachian tube disorder, left ear: Secondary | ICD-10-CM | POA: Diagnosis not present

## 2022-10-23 MED ORDER — FLUTICASONE PROPIONATE 50 MCG/ACT NA SUSP: 2.0000 | Freq: Every day | NASAL | 0 refills | Status: AC

## 2022-10-23 NOTE — Progress Notes (Signed)
E-Visit for Ear Pain - Eustachian Tube Dysfunction   We are sorry that you are not feeling well. Here is how we plan to help!  Based on what you have shared with me it looks like you have Eustachian Tube Dysfunction.  Eustachian Tube Dysfunction is a condition where the tubes that connect your middle ears to your upper throat become blocked. This can lead to discomfort, hearing difficulties and a feeling of fullness in your ear. Eustachian tube dysfunction usually resolves itself in a few days. The usual symptoms include: Hearing problems Tinnitus, or ringing in your ears Clicking or popping sounds A feeling of fullness in your ears Pain that mimics an ear infection Dizziness, vertigo or balance problems A "tickling" sensation in your ears  ?Eustachian tube dysfunction symptoms may get worse in higher altitudes. This is called barotrauma, and it can happen while scuba diving, flying in an airplane or driving in the mountains.   What causes eustachian tube dysfunction? Allergies and infections (like the common cold and the flu) are the most common causes of eustachian tube dysfunction. These conditions can cause inflammation and mucus buildup, leading to blockage. GERD, or chronic acid reflux, can also cause ETD. This is because stomach acid can back up into your throat and result in inflammation. As mentioned above, altitude changes can also cause ETD.   What are some common eustachian tube dysfunction treatments? In most cases, treatment isn't necessary because ETD often resolves on its own. However, you might need treatment if your symptoms linger for more than two weeks.    Eustachian tube dysfunction treatment depends on the cause and the severity of your condition. Treatments may include home remedies, medications or, in severe cases, surgery.     HOME CARE: Sometimes simple home remedies can help with mild cases of eustachian tube dysfunction. To try and clear the blockage, you  can: Chew gum. Yawn. Swallow. Try the Valsalva maneuver (breathing out forcefully while closing your mouth and pinching your nostrils). Use a saline spray to clear out nasal passages.  MEDICATIONS: Over-the-counter medications can help if allergies are causing eustachian tube dysfunction. Try antihistamines (like cetirizine or diphenhydramine) to ease your symptoms. If you have discomfort, pain relievers -- such as acetaminophen or ibuprofen -- can help.  Sometimes intranasal glucocorticosteroids (like Flonase or Nasacort) help.  I have prescribed Fluticasone 50 mcg/spray 2 sprays in each nostril daily for 10-14 days    GET HELP RIGHT AWAY IF: Fever is over 102.2 degrees. You develop progressive ear pain or hearing loss. Ear symptoms persist longer than 3 days after treatment.  MAKE SURE YOU: Understand these instructions. Will watch your condition. Will get help right away if you are not doing well or get worse.  Thank you for choosing an e-visit.  Your e-visit answers were reviewed by a board certified advanced clinical practitioner to complete your personal care plan. Depending upon the condition, your plan could have included both over the counter or prescription medications.  Please review your pharmacy choice. Make sure the pharmacy is open so you can pick up the prescription now. If there is a problem, you may contact your provider through MyChart messaging and have the prescription routed to another pharmacy.  Your safety is important to us. If you have drug allergies check your prescription carefully.   For the next 24 hours you can use MyChart to ask questions about today's visit, request a non-urgent call back, or ask for a work or school excuse. You will   get an email with a survey after your eVisit asking about your experience. We would appreciate your feedback. I hope that your e-visit has been valuable and will aid in your recovery.     I provided 5 minutes of non  face-to-face time during this encounter for chart review, medication and order placement, as well as and documentation.   

## 2022-11-18 ENCOUNTER — Encounter: Payer: Self-pay | Admitting: Internal Medicine

## 2022-11-19 NOTE — Telephone Encounter (Signed)
Called this number to complete the PA and it just continuously rung. Will try again later.

## 2022-11-21 ENCOUNTER — Other Ambulatory Visit: Payer: Self-pay

## 2022-11-21 ENCOUNTER — Telehealth: Payer: Self-pay

## 2022-11-21 DIAGNOSIS — E669 Obesity, unspecified: Secondary | ICD-10-CM

## 2022-11-21 MED ORDER — SEMAGLUTIDE-WEIGHT MANAGEMENT 0.5 MG/0.5ML ~~LOC~~ SOAJ: 0.5000 mg | SUBCUTANEOUS | 0 refills | Status: DC

## 2022-11-21 MED ORDER — SEMAGLUTIDE-WEIGHT MANAGEMENT 2.4 MG/0.75ML ~~LOC~~ SOAJ
2.4000 mg | SUBCUTANEOUS | 0 refills | Status: DC
Start: 2023-03-13 — End: 2023-02-19

## 2022-11-21 MED ORDER — SEMAGLUTIDE-WEIGHT MANAGEMENT 1 MG/0.5ML ~~LOC~~ SOAJ
1.0000 mg | SUBCUTANEOUS | 0 refills | Status: AC
Start: 2023-01-16 — End: 2023-02-13

## 2022-11-21 MED ORDER — SEMAGLUTIDE-WEIGHT MANAGEMENT 1.7 MG/0.75ML ~~LOC~~ SOAJ
1.7000 mg | SUBCUTANEOUS | 0 refills | Status: DC
Start: 2023-02-13 — End: 2023-02-19

## 2022-11-21 MED ORDER — SEMAGLUTIDE-WEIGHT MANAGEMENT 0.5 MG/0.5ML ~~LOC~~ SOAJ: 0.5000 mg | SUBCUTANEOUS | 0 refills | Status: AC

## 2022-11-21 MED ORDER — SEMAGLUTIDE-WEIGHT MANAGEMENT 1 MG/0.5ML ~~LOC~~ SOAJ: 1.0000 mg | SUBCUTANEOUS | 0 refills | Status: DC

## 2022-11-21 MED ORDER — SEMAGLUTIDE-WEIGHT MANAGEMENT 0.25 MG/0.5ML ~~LOC~~ SOAJ: 0.2500 mg | SUBCUTANEOUS | 0 refills | Status: DC

## 2022-11-21 MED ORDER — SEMAGLUTIDE-WEIGHT MANAGEMENT 0.25 MG/0.5ML ~~LOC~~ SOAJ: 0.2500 mg | SUBCUTANEOUS | 0 refills | Status: AC

## 2022-11-21 MED ORDER — SEMAGLUTIDE-WEIGHT MANAGEMENT 2.4 MG/0.75ML ~~LOC~~ SOAJ: 2.4000 mg | SUBCUTANEOUS | 0 refills | Status: DC

## 2022-11-21 MED ORDER — SEMAGLUTIDE-WEIGHT MANAGEMENT 1.7 MG/0.75ML ~~LOC~~ SOAJ: 1.7000 mg | SUBCUTANEOUS | 0 refills | Status: DC

## 2022-11-21 NOTE — Telephone Encounter (Signed)
*  Primary  PA request received for Wegovy 1.7MG /0.75ML auto-injectors  PA submitted to [employer] via CMM and is pending determination  Key: [id number]

## 2022-11-24 NOTE — Telephone Encounter (Signed)
Sent my chart message to patient with an update of the [facility] prior authorization. I am working on the appeal, as it was denied.

## 2022-11-28 NOTE — Telephone Encounter (Signed)
PA initially denied, per additional note providers office sent to plan to initiate appeal.

## 2023-02-19 ENCOUNTER — Inpatient Hospital Stay (HOSPITAL_COMMUNITY)
Admission: AD | Admit: 2023-02-19 | Discharge: 2023-02-20 | Disposition: A | Discharge status: Home / Self Care | Payer: Managed Care, Other (non HMO) | Source: Home / Self Care | Attending: Obstetrics & Gynecology | Admitting: Obstetrics & Gynecology

## 2023-02-19 ENCOUNTER — Inpatient Hospital Stay (HOSPITAL_COMMUNITY)
Admission: AD | Admit: 2023-02-19 | Discharge: 2023-02-19 | Disposition: A | Discharge status: Home / Self Care | Payer: Managed Care, Other (non HMO) | Attending: Obstetrics & Gynecology | Admitting: Obstetrics & Gynecology

## 2023-02-19 ENCOUNTER — Encounter (HOSPITAL_COMMUNITY): Payer: Self-pay | Admitting: *Deleted

## 2023-02-19 ENCOUNTER — Inpatient Hospital Stay (HOSPITAL_COMMUNITY): Payer: Managed Care, Other (non HMO)

## 2023-02-19 DIAGNOSIS — O26851 Spotting complicating pregnancy, first trimester: Secondary | ICD-10-CM | POA: Diagnosis not present

## 2023-02-19 DIAGNOSIS — O219 Vomiting of pregnancy, unspecified: Secondary | ICD-10-CM | POA: Insufficient documentation

## 2023-02-19 DIAGNOSIS — O209 Hemorrhage in early pregnancy, unspecified: Secondary | ICD-10-CM | POA: Diagnosis present

## 2023-02-19 DIAGNOSIS — Z3A09 9 weeks gestation of pregnancy: Secondary | ICD-10-CM | POA: Insufficient documentation

## 2023-02-19 DIAGNOSIS — O99341 Other mental disorders complicating pregnancy, first trimester: Secondary | ICD-10-CM | POA: Insufficient documentation

## 2023-02-19 DIAGNOSIS — J45909 Unspecified asthma, uncomplicated: Secondary | ICD-10-CM | POA: Insufficient documentation

## 2023-02-19 DIAGNOSIS — R109 Unspecified abdominal pain: Secondary | ICD-10-CM

## 2023-02-19 DIAGNOSIS — F419 Anxiety disorder, unspecified: Secondary | ICD-10-CM | POA: Diagnosis not present

## 2023-02-19 DIAGNOSIS — O200 Threatened abortion: Secondary | ICD-10-CM | POA: Insufficient documentation

## 2023-02-19 DIAGNOSIS — O3680X0 Pregnancy with inconclusive fetal viability, not applicable or unspecified: Secondary | ICD-10-CM | POA: Diagnosis not present

## 2023-02-19 DIAGNOSIS — O99511 Diseases of the respiratory system complicating pregnancy, first trimester: Secondary | ICD-10-CM | POA: Insufficient documentation

## 2023-02-19 DIAGNOSIS — O99891 Other specified diseases and conditions complicating pregnancy: Secondary | ICD-10-CM | POA: Insufficient documentation

## 2023-02-19 DIAGNOSIS — O26891 Other specified pregnancy related conditions, first trimester: Secondary | ICD-10-CM

## 2023-02-19 DIAGNOSIS — F32A Depression, unspecified: Secondary | ICD-10-CM | POA: Diagnosis not present

## 2023-02-19 LAB — URINALYSIS, ROUTINE W REFLEX MICROSCOPIC
Bacteria, UA: NONE SEEN
Bilirubin Urine: NEGATIVE
Glucose, UA: NEGATIVE mg/dL
Ketones, ur: NEGATIVE mg/dL
Nitrite: NEGATIVE
Protein, ur: NEGATIVE mg/dL
Specific Gravity, Urine: 1.019 (ref 1.005–1.030)
pH: 7 (ref 5.0–8.0)

## 2023-02-19 LAB — ABO/RH: ABO/RH(D): AB POS

## 2023-02-19 LAB — HCG, QUANTITATIVE, PREGNANCY: hCG, Beta Chain, Quant, S: 16898 m[IU]/mL — ABNORMAL HIGH (ref <5)

## 2023-02-19 LAB — CBC
HCT: 40 % (ref 36.0–46.0)
Hemoglobin: 13.2 g/dL (ref 12.0–15.0)
MCH: 28 pg (ref 26.0–34.0)
MCHC: 33 g/dL (ref 30.0–36.0)
MCV: 84.9 fL (ref 80.0–100.0)
Platelets: 272 10*3/uL (ref 150–400)
RBC: 4.71 MIL/uL (ref 3.87–5.11)
RDW: 13.6 % (ref 11.5–15.5)
WBC: 13.8 10*3/uL — ABNORMAL HIGH (ref 4.0–10.5)
nRBC: 0.1 % (ref 0.0–0.2)

## 2023-02-19 LAB — POCT PREGNANCY, URINE: Preg Test, Ur: POSITIVE — AB

## 2023-02-19 LAB — WET PREP, GENITAL
Clue Cells Wet Prep HPF POC: NONE SEEN
Sperm: NONE SEEN
Trich, Wet Prep: NONE SEEN
WBC, Wet Prep HPF POC: >=10 — AB (ref <10)
Yeast Wet Prep HPF POC: NONE SEEN

## 2023-02-19 LAB — HIV ANTIBODY (ROUTINE TESTING W REFLEX): HIV Screen 4th Generation wRfx: NONREACTIVE

## 2023-02-19 MED ORDER — ONDANSETRON 4 MG PO TBDP
4.0000 mg | ORAL_TABLET | Freq: Once | ORAL | Status: AC
  Administered 2023-02-19: 4 mg via ORAL
  Filled 2023-02-19: qty 1

## 2023-02-19 MED ORDER — OXYCODONE-ACETAMINOPHEN 5-325 MG PO TABS
1.0000 | ORAL_TABLET | Freq: Once | ORAL | Status: AC
  Administered 2023-02-19: 2 via ORAL
  Filled 2023-02-19: qty 2

## 2023-02-19 NOTE — MAU Note (Addendum)
.  Cindy Pope is a 22 y.o. at [redacted]w[redacted]d here in MAU reporting pt was here earlier today with spotting. Was told if had heavier bleeding, cramping to come back. Reports having heavier VB and cramping since 1930. Vomited on way to hospital. Took Tylenol 2 reg strength at 2115 that did not help. Has used 2 adult diapers since bleeding started. Few small clots  Onset of complaint: 1930 Pain score: 10 Vitals:   02/19/23 2215 02/19/23 2217  BP:  124/74  Pulse: 72   Resp: 18   Temp: 98.1 F (36.7 C)   SpO2: 99%      FHT:n/a Lab orders placed from triage:  none

## 2023-02-19 NOTE — MAU Note (Signed)
Called Main Lab to request UA to be ran as it was sent at 1049.

## 2023-02-19 NOTE — MAU Provider Note (Signed)
Chief Complaint: Abdominal Pain and Vaginal Bleeding   Event Date/Time   First Provider Initiated Contact with Patient 02/19/23 2238        SUBJECTIVE HPI: Cindy Pope is a 22 y.o. G1P0 at [redacted]w[redacted]d by LMP who presents to maternity admissions reporting heavy bleeding and cramping.  Was seen here earlier this afternoon and diagnosed with threatened abortion.  US showed a 6 week questionable gestational sac. She denies vaginal itching/burning, urinary symptoms, h/a, dizziness, n/v, or fever/chills.     Abdominal Pain This is a recurrent problem. The current episode started today. The problem has been gradually worsening since onset. The pain does not radiate. Pertinent negatives include no diarrhea, dysuria or fever. Nothing relieves the symptoms.  Vaginal Bleeding The patient's primary symptoms include pelvic pain and vaginal bleeding. The problem has been gradually worsening. Associated symptoms include abdominal pain. Pertinent negatives include no diarrhea, dysuria or fever.   RN Note: Cindy Pope is a 22 y.o. at [redacted]w[redacted]d here in MAU reporting pt was here earlier today with spotting. Was told if had heavier bleeding, cramping to come back. Reports having heavier VB and cramping since 1930. Vomited on way to hospital. Took Tylenol 2 reg strength at 2115 that did not help. Has used 2 adult diapers since bleeding started. Few small clots   Past Medical History:  Diagnosis Date   Allergy Sep. 11th 2023   Caffine, break into hives and sweats if i drink to much of it.   Anxiety    Depression    Drug-induced asthma 04/01/2022   Hx of migraines    Prediabetes    No past surgical history on file. Social History   Socioeconomic History   Marital status: Single    Spouse name: Not on file   Number of children: Not on file   Years of education: Not on file   Highest education level: Associate degree: occupational, Scientist, product/process development, or vocational program  Occupational History   Not on file  Tobacco  Use   Smoking status: Never   Smokeless tobacco: Never  Vaping Use   Vaping status: Former   Quit date: 12/10/2021  Substance and Sexual Activity   Alcohol use: No   Drug use: Never   Sexual activity: Not Currently    Birth control/protection: Condom    Comment: pt getting Nexplanon at GYN appt upcoming  Other Topics Concern   Not on file  Social History Narrative   Not on file   Social Determinants of Health   Financial Resource Strain: Low Risk  (08/31/2022)   Overall Financial Resource Strain (CARDIA)    Difficulty of Paying Living Expenses: Not hard at all  Food Insecurity: No Food Insecurity (08/31/2022)   Hunger Vital Sign    Worried About Running Out of Food in the Last Year: Never true    Ran Out of Food in the Last Year: Never true  Transportation Needs: No Transportation Needs (08/31/2022)   PRAPARE - Administrator, Civil Service (Medical): No    Lack of Transportation (Non-Medical): No  Physical Activity: Sufficiently Active (08/31/2022)   Exercise Vital Sign    Days of Exercise per Week: 5 days    Minutes of Exercise per Session: 30 min  Stress: No Stress Concern Present (08/31/2022)   [employer] of Occupational Health - Occupational Stress Questionnaire    Feeling of Stress : Not at all  Social Connections: Moderately Isolated (08/31/2022)   Social Connection and Isolation Panel [NHANES]  Frequency of Communication with Friends and Family: More than three times a week    Frequency of Social Gatherings with Friends and Family: More than three times a week    Attends Religious Services: More than 4 times per year    Active Member of [employer] or Organizations: No    Attends Engineer, structural: Not on file    Marital Status: Never married  Intimate Partner Violence: Not on file   No current facility-administered medications on file prior to encounter.   Current Outpatient Medications on File Prior to Encounter  Medication Sig Dispense  Refill   albuterol (VENTOLIN HFA) 108 (90 Base) MCG/ACT inhaler INHALE 2 PUFFS INTO THE LUNGS EVERY 6 HOURS AS NEEDED FOR WHEEZING OR SHORTNESS OF BREATH 8.5 g 2   FLUoxetine (PROZAC) 20 MG tablet Take 1 tablet (20 mg total) by mouth daily. 90 tablet 3   fluticasone (FLONASE) 50 MCG/ACT nasal spray Place 2 sprays into both nostrils daily. 16 g 0   metFORMIN (GLUCOPHAGE) 500 MG tablet Take by mouth.     omeprazole (PRILOSEC) 20 MG capsule Take 20 mg by mouth daily.     Allergies  Allergen Reactions   Caffeine Hives and Other (See Comments)    I have reviewed patient's Past Medical Hx, Surgical Hx, Family Hx, Social Hx, medications and allergies.   ROS:  Review of Systems  Constitutional:  Negative for fever.  Gastrointestinal:  Positive for abdominal pain. Negative for diarrhea.  Genitourinary:  Positive for pelvic pain and vaginal bleeding. Negative for dysuria.   Review of Systems  Other systems negative   Physical Exam  Physical Exam Patient Vitals for the past 24 hrs:  BP Temp Pulse Resp SpO2 Height Weight  02/19/23 2217 124/74 -- -- -- -- -- --  02/19/23 2215 -- 98.1 F (36.7 C) 72 18 99 % 5\' 1"  (1.549 m) 87.5 kg   Constitutional: Well-developed, well-nourished female in no acute distress.  Cardiovascular: normal rate Respiratory: normal effort GI: Abd soft, non-tender. Pos  MS: Extremities nontender, no edema, normal ROM Neurologic: Alert and oriented x 4.  GU: Neg CVAT.  PELVIC EXAM: deferred due to recent exam  LAB RESULTS Results for orders placed or performed during the hospital encounter of 02/19/23 (from the past 24 hour(s))  Wet prep, genital     Status: Abnormal   Collection Time: 02/19/23 10:55 AM   Specimen: Vaginal  Result Value Ref Range   Yeast Wet Prep HPF POC NONE SEEN NONE SEEN   Trich, Wet Prep NONE SEEN NONE SEEN   Clue Cells Wet Prep HPF POC NONE SEEN NONE SEEN   WBC, Wet Prep HPF POC >=10 (A) <10   Sperm NONE SEEN   Pregnancy, urine POC      Status: Abnormal   Collection Time: 02/19/23 10:56 AM  Result Value Ref Range   Preg Test, Ur POSITIVE (A) NEGATIVE  Urinalysis, Routine w reflex microscopic -Urine, Clean Catch     Status: Abnormal   Collection Time: 02/19/23 10:59 AM  Result Value Ref Range   Color, Urine YELLOW YELLOW   APPearance CLOUDY (A) CLEAR   Specific Gravity, Urine 1.019 1.005 - 1.030   pH 7.0 5.0 - 8.0   Glucose, UA NEGATIVE NEGATIVE mg/dL   Hgb urine dipstick LARGE (A) NEGATIVE   Bilirubin Urine NEGATIVE NEGATIVE   Ketones, ur NEGATIVE NEGATIVE mg/dL   Protein, ur NEGATIVE NEGATIVE mg/dL   Nitrite NEGATIVE NEGATIVE   Leukocytes,Ua MODERATE (A) NEGATIVE  RBC / HPF 11-20 0 - 5 RBC/hpf   WBC, UA 6-10 0 - 5 WBC/hpf   Bacteria, UA NONE SEEN NONE SEEN   Squamous Epithelial / HPF 21-50 0 - 5 /HPF  ABO/Rh     Status: None   Collection Time: 02/19/23 11:09 AM  Result Value Ref Range   ABO/RH(D) AB POS    No rh immune globuloin      NOT A RH IMMUNE GLOBULIN CANDIDATE, PT RH POSITIVE Performed at [facility], [address], [place], [place] [address]   CBC     Status: Abnormal   Collection Time: 02/19/23 11:10 AM  Result Value Ref Range   WBC 13.8 (H) 4.0 - 10.5 K/uL   RBC 4.71 3.87 - 5.11 MIL/uL   Hemoglobin 13.2 12.0 - 15.0 g/dL   HCT 60.4 54.0 - 98.1 %   MCV 84.9 80.0 - 100.0 fL   MCH 28.0 26.0 - 34.0 pg   MCHC 33.0 30.0 - 36.0 g/dL   RDW 19.1 47.8 - 29.5 %   Platelets 272 150 - 400 K/uL   nRBC 0.1 0.0 - 0.2 %  hCG, quantitative, pregnancy     Status: Abnormal   Collection Time: 02/19/23 11:10 AM  Result Value Ref Range   hCG, Beta Chain, Quant, S 16,898 (H) <5 mIU/mL  HIV Antibody (routine testing w rflx)     Status: None   Collection Time: 02/19/23 11:10 AM  Result Value Ref Range   HIV Screen 4th Generation wRfx Non Reactive Non Reactive    --/--/AB POS (10/10 1109)  IMAGING US OB Transvaginal  Result Date: 02/19/2023 CLINICAL DATA:  One-week history of spotting and  cramping EXAM: TRANSVAGINAL OB ULTRASOUND TECHNIQUE: Transvaginal ultrasound was performed for complete evaluation of the gestation as well as the maternal uterus, adnexal regions, and pelvic cul-de-sac. COMPARISON:  None Available. FINDINGS: Intrauterine gestational sac: Questionable single Yolk sac:  Not Visualized. Embryo:  Not Visualized. MSD: 14.6 mm   6 w   2 d Subchorionic hemorrhage:  None visualized. Maternal uterus/adnexae: Retroverted uterus.  Normal ovaries. IMPRESSION: Pregnancy of unknown location with indeterminate intrauterine fluid collection. Recommend follow up beta-HCG levels and endovaginal ultrasound examination in 7-10 days. Electronically Signed   By: Agustin Cree M.D.   On: 02/19/2023 14:56     MAU Management/MDM: I have reviewed the triage vital signs and the nursing notes.   Pertinent labs & imaging results that were available during my care of the patient were reviewed by me and considered in my medical decision making (see chart for details).      I have reviewed her medical records including past results, notes and treatments. Medical, Surgical, and family history were reviewed.  Medications and recent lab tests were reviewed  Ordered repeat CBC which showed slight decrease in hemoglobin  DIscussed it will not be helpful to repeat labs or [place] this close to prior ones today.  Treatments in MAU included Zofran and Percocet which relieved pain.   This bleeding/pain can represent a normal pregnancy with bleeding, spontaneous abortion or even an ectopic which can be life-threatening.  The process as listed above helps to determine which of these is present.  Discussed probability that with new heavy bleeding, this is likely a miscarriage. Discussed best option is to repeat HCG in 2 days to see if it goes down.  Agrees to this plan  ASSESSMENT Pregnancy at [redacted]w[redacted]d Threatened abortion Heavy bleeding, likely SAB  PLAN Discharge home Plan  to repeat HCG level in 48  hours Bleeding precautions  Pt stable at time of discharge. Encouraged to return here if she develops worsening of symptoms, increase in pain, fever, or other concerning symptoms.    Wynelle Bourgeois CNM, MSN Certified Nurse-Midwife 02/19/2023  10:38 PM

## 2023-02-19 NOTE — MAU Note (Signed)
.  Cindy Pope is a 22 y.o. at Unknown here in MAU reporting: has had spotting for over a week and mild cramping. [facility] appointment today and they said they saw a sac but no baby or heartbeat.  LMP: 12/12/2021 Onset of complaint: 1 week Pain score: 4 Vitals:   02/19/23 1038  BP: (!) 142/66  Pulse: 96  Resp: 18  Temp: 98.3 F (36.8 C)     FHT:n/a Lab orders placed from triage:  ua

## 2023-02-19 NOTE — MAU Provider Note (Signed)
History     CSN: [id number]  Arrival date and time: 02/19/23 1008   Event Date/Time   First Provider Initiated Contact with Patient 02/19/23 1135      Chief Complaint  Patient presents with   Vaginal Bleeding   Abdominal Pain   HPI Cindy Pope is a 22 y.o. year old G1P0 female at [redacted]w[redacted]d gestation who presents to MAU reporting vaginal spotting for over 1 week.  She also reports lower abdominal cramping that she describes as mild.  She rates the cramping 4 out of 10 on a pain scale.  [facility].  The [facility] the [facility] only revealed a sac with no baby and no heartbeat.  She was advised to come to MAU for further evaluation.  She and her mother are both concerned because she received a Depo-Provera shot the week of January 23, 2023.  Her mother is present and contributing to the history taking.   OB History     Gravida  1   Para      Term      Preterm      AB      Living         SAB      IAB      Ectopic      Multiple      Live Births              Past Medical History:  Diagnosis Date   Allergy Sep. 11th 2023   Caffine, break into hives and sweats if i drink to much of it.   Anxiety    Depression    Drug-induced asthma 04/01/2022   Hx of migraines    Prediabetes     History reviewed. No pertinent surgical history.  Family History  Problem Relation Age of Onset   Anxiety disorder Mother    Depression Mother    Obesity Mother    Stroke Mother    Hypertension Mother    Anxiety disorder Father    Depression Father    Obesity Father    Diabetes Paternal Uncle    Diabetes Maternal Grandmother    Obesity Maternal Grandmother    Hearing loss Maternal Grandfather    Obesity Maternal Grandfather    Cancer Maternal Grandfather    Diabetes Paternal Grandmother    Hearing loss Paternal Grandmother    Obesity Paternal Grandmother    Diabetes Paternal  Grandfather    Hearing loss Paternal Grandfather    Obesity Paternal Grandfather     Social History   Tobacco Use   Smoking status: Never   Smokeless tobacco: Never  Vaping Use   Vaping status: Former   Quit date: 12/10/2021  Substance Use Topics   Alcohol use: No   Drug use: Never    Allergies:  Allergies  Allergen Reactions   Caffeine Hives and Other (See Comments)    Medications Prior to Admission  Medication Sig Dispense Refill Last Dose   albuterol (VENTOLIN HFA) 108 (90 Base) MCG/ACT inhaler INHALE 2 PUFFS INTO THE LUNGS EVERY 6 HOURS AS NEEDED FOR WHEEZING OR SHORTNESS OF BREATH 8.5 g 2    benzonatate (TESSALON PERLES) 100 MG capsule Take 1 capsule (100 mg total) by mouth 3 (three) times daily as needed for cough. 20 capsule 0    FLUoxetine (PROZAC) 20 MG tablet Take 1 tablet (20 mg total) by mouth daily. 90 tablet 3  fluticasone (FLONASE) 50 MCG/ACT nasal spray Place 2 sprays into both nostrils daily. 16 g 0    ibuprofen (ADVIL) 100 MG tablet Take by mouth.      metFORMIN (GLUCOPHAGE) 500 MG tablet Take by mouth.      norgestimate-ethinyl estradiol (ORTHO-CYCLEN) 0.25-35 MG-MCG tablet Take 1 tablet by mouth daily.      omeprazole (PRILOSEC) 20 MG capsule Take 20 mg by mouth daily.      promethazine-dextromethorphan (PROMETHAZINE-DM) 6.25-15 MG/5ML syrup Take 5 mLs by mouth at bedtime as needed for cough. 120 mL 0    propranolol (INDERAL) 10 MG tablet Take one tab po about 30 minutes prior to anxiety-provoking event. 15 tablet 0    Semaglutide-Weight Management 1.7 MG/0.75ML SOAJ Inject 1.7 mg into the skin once a week for 28 days. 3 mL 0    [START ON 03/13/2023] Semaglutide-Weight Management 2.4 MG/0.75ML SOAJ Inject 2.4 mg into the skin once a week for 28 days. 3 mL 0     Review of Systems  Constitutional: Negative.   HENT: Negative.    Eyes: Negative.   Respiratory: Negative.    Cardiovascular: Negative.   Gastrointestinal: Negative.   Endocrine: Negative.    Genitourinary:  Positive for pelvic pain and vaginal bleeding.  Musculoskeletal: Negative.   Skin: Negative.   Allergic/Immunologic: Negative.   Neurological: Negative.   Hematological: Negative.   Psychiatric/Behavioral: Negative.     Physical Exam   Blood pressure 130/69, pulse 90, temperature 98.3 F (36.8 C), resp. rate 18, height 5\' 1"  (1.549 m), weight 88.9 kg, last menstrual period 12/13/2022, SpO2 99%.  Physical Exam Vitals and nursing note reviewed.  Constitutional:      Appearance: Normal appearance. She is obese.  Cardiovascular:     Rate and Rhythm: Normal rate.  Pulmonary:     Effort: Pulmonary effort is normal.  Abdominal:     General: Abdomen is flat.     Palpations: Abdomen is soft.  Genitourinary:    Comments: Swabs collected by patient using blind swab technique  Skin:    General: Skin is warm and dry.  Neurological:     Mental Status: She is alert and oriented to person, place, and time.  Psychiatric:        Mood and Affect: Mood normal.        Behavior: Behavior normal.        Thought Content: Thought content normal.        Judgment: Judgment normal.     MAU Course  Procedures  MDM CCUA UPT CBC ABO/Rh HCG OB U/S < 14 wks TVUS  Results for orders placed or performed during the hospital encounter of 02/19/23 (from the past 24 hour(s))  Wet prep, genital     Status: Abnormal   Collection Time: 02/19/23 10:55 AM   Specimen: Vaginal  Result Value Ref Range   Yeast Wet Prep HPF POC NONE SEEN NONE SEEN   Trich, Wet Prep NONE SEEN NONE SEEN   Clue Cells Wet Prep HPF POC NONE SEEN NONE SEEN   WBC, Wet Prep HPF POC >=10 (A) <10   Sperm NONE SEEN   Pregnancy, urine POC     Status: Abnormal   Collection Time: 02/19/23 10:56 AM  Result Value Ref Range   Preg Test, Ur POSITIVE (A) NEGATIVE  Urinalysis, Routine w reflex microscopic -Urine, Clean Catch     Status: Abnormal   Collection Time: 02/19/23 10:59 AM  Result Value Ref Range   Color,  Urine YELLOW  YELLOW   APPearance CLOUDY (A) CLEAR   Specific Gravity, Urine 1.019 1.005 - 1.030   pH 7.0 5.0 - 8.0   Glucose, UA NEGATIVE NEGATIVE mg/dL   Hgb urine dipstick LARGE (A) NEGATIVE   Bilirubin Urine NEGATIVE NEGATIVE   Ketones, ur NEGATIVE NEGATIVE mg/dL   Protein, ur NEGATIVE NEGATIVE mg/dL   Nitrite NEGATIVE NEGATIVE   Leukocytes,Ua MODERATE (A) NEGATIVE   RBC / HPF 11-20 0 - 5 RBC/hpf   WBC, UA 6-10 0 - 5 WBC/hpf   Bacteria, UA NONE SEEN NONE SEEN   Squamous Epithelial / HPF 21-50 0 - 5 /HPF  ABO/Rh     Status: None   Collection Time: 02/19/23 11:09 AM  Result Value Ref Range   ABO/RH(D) AB POS    No rh immune globuloin      NOT A RH IMMUNE GLOBULIN CANDIDATE, PT RH POSITIVE Performed at [facility], [address], [place], [place] [address]   CBC     Status: Abnormal   Collection Time: 02/19/23 11:10 AM  Result Value Ref Range   WBC 13.8 (H) 4.0 - 10.5 K/uL   RBC 4.71 3.87 - 5.11 MIL/uL   Hemoglobin 13.2 12.0 - 15.0 g/dL   HCT 95.6 38.7 - 56.4 %   MCV 84.9 80.0 - 100.0 fL   MCH 28.0 26.0 - 34.0 pg   MCHC 33.0 30.0 - 36.0 g/dL   RDW 33.2 95.1 - 88.4 %   Platelets 272 150 - 400 K/uL   nRBC 0.1 0.0 - 0.2 %  hCG, quantitative, pregnancy     Status: Abnormal   Collection Time: 02/19/23 11:10 AM  Result Value Ref Range   hCG, Beta Chain, Quant, S 16,898 (H) <5 mIU/mL  HIV Antibody (routine testing w rflx)     Status: None   Collection Time: 02/19/23 11:10 AM  Result Value Ref Range   HIV Screen 4th Generation wRfx Non Reactive Non Reactive    US OB Transvaginal  Result Date: 02/19/2023 CLINICAL DATA:  One-week history of spotting and cramping EXAM: TRANSVAGINAL OB ULTRASOUND TECHNIQUE: Transvaginal ultrasound was performed for complete evaluation of the gestation as well as the maternal uterus, adnexal regions, and pelvic cul-de-sac. COMPARISON:  None Available. FINDINGS: Intrauterine gestational sac: Questionable single Yolk sac:  Not Visualized.  Embryo:  Not Visualized. MSD: 14.6 mm   6 w   2 d Subchorionic hemorrhage:  None visualized. Maternal uterus/adnexae: Retroverted uterus.  Normal ovaries. IMPRESSION: Pregnancy of unknown location with indeterminate intrauterine fluid collection. Recommend follow up beta-HCG levels and endovaginal ultrasound examination in 7-10 days. Electronically Signed   By: Agustin Cree M.D.   On: 02/19/2023 14:56     *Consult with Dr. Despina Hidden @ 1510 - notified of patient's complaints, assessments, lab & U/S results, tx plan d/c home with threatened miscarriage, pregnancy of uncertain viability precautions - ok to d/c home, agrees with plan   Assessment and Plan  1. Spotting and cramping affecting pregnancy, antepartum - Information provided on abdominal pain and vaginal bleeding in pregnancy - Return to MAU: If you have heavier bleeding that soaks through more that 2 pads per hour for an hour or more If you bleed so much that you feel like you might pass out or you do pass out If you have significant abdominal pain that is not improved with Tylenol 1000 mg every 8 hours as needed for pain If you develop a fever > 100.5   2. Threatened miscarriage in  early pregnancy - Information provided on threatened miscarriage  3. Pregnancy with uncertain fetal viability, single or unspecified fetus -Advised to have a repeat ultrasound in 2 weeks -Ultrasound scheduled in Epic for 03/09/2023  4. [redacted] weeks gestation of pregnancy   - Discharge patient - Keep scheduled appt on 03/09/2023 at [facility] - Patient verbalized an understanding of the plan of care and agrees.    Raelyn Mora, CNM 02/19/2023, 11:35 AM

## 2023-02-19 NOTE — MAU Note (Signed)
Wynelle Bourgeois CNM in Family Rm to talk with pt

## 2023-02-19 NOTE — Discharge Instructions (Signed)
Return to MAU: If you have heavier bleeding that soaks through more that 2 pads per hour for an hour or more If you bleed so much that you feel like you might pass out or you do pass out If you have significant abdominal pain that is not improved with Tylenol 1000 mg every 8 hours as needed for pain If you develop a fever > 100.5  

## 2023-02-20 ENCOUNTER — Telehealth: Payer: Self-pay | Admitting: Obstetrics and Gynecology

## 2023-02-20 ENCOUNTER — Other Ambulatory Visit: Payer: Self-pay | Admitting: Obstetrics and Gynecology

## 2023-02-20 ENCOUNTER — Encounter: Payer: Self-pay | Admitting: Obstetrics and Gynecology

## 2023-02-20 DIAGNOSIS — O200 Threatened abortion: Secondary | ICD-10-CM

## 2023-02-20 DIAGNOSIS — A749 Chlamydial infection, unspecified: Secondary | ICD-10-CM

## 2023-02-20 DIAGNOSIS — Z3A09 9 weeks gestation of pregnancy: Secondary | ICD-10-CM

## 2023-02-20 LAB — CBC
HCT: 38.5 % (ref 36.0–46.0)
Hemoglobin: 12.5 g/dL (ref 12.0–15.0)
MCH: 27.4 pg (ref 26.0–34.0)
MCHC: 32.5 g/dL (ref 30.0–36.0)
MCV: 84.4 fL (ref 80.0–100.0)
Platelets: 306 10*3/uL (ref 150–400)
RBC: 4.56 MIL/uL (ref 3.87–5.11)
RDW: 13.7 % (ref 11.5–15.5)
WBC: 15.3 10*3/uL — ABNORMAL HIGH (ref 4.0–10.5)
nRBC: 0 % (ref 0.0–0.2)

## 2023-02-20 LAB — GC/CHLAMYDIA PROBE AMP (~~LOC~~) NOT AT ARMC
Chlamydia: POSITIVE — AB
Comment: NEGATIVE
Comment: NORMAL
Neisseria Gonorrhea: NEGATIVE

## 2023-02-20 MED ORDER — AZITHROMYCIN 250 MG PO TABS
1000.0000 mg | ORAL_TABLET | Freq: Once | ORAL | 0 refills | Status: AC
Start: 2023-02-20 — End: 2023-02-20

## 2023-02-20 NOTE — Telephone Encounter (Signed)
TC to notify of (+) CT results. No message left. Rx sent to pharmacy on file. MyChart message sent to patient.  Raelyn Mora, CNM

## 2023-02-22 ENCOUNTER — Inpatient Hospital Stay (HOSPITAL_COMMUNITY)
Admission: AD | Admit: 2023-02-22 | Discharge: 2023-02-22 | Disposition: A | Discharge status: Home / Self Care | Payer: Managed Care, Other (non HMO) | Attending: Obstetrics and Gynecology | Admitting: Obstetrics and Gynecology

## 2023-02-22 DIAGNOSIS — Z3A10 10 weeks gestation of pregnancy: Secondary | ICD-10-CM

## 2023-02-22 DIAGNOSIS — O039 Complete or unspecified spontaneous abortion without complication: Secondary | ICD-10-CM | POA: Insufficient documentation

## 2023-02-22 LAB — HCG, QUANTITATIVE, PREGNANCY: hCG, Beta Chain, Quant, S: 1750 m[IU]/mL — ABNORMAL HIGH (ref <5)

## 2023-02-22 MED ORDER — OXYCODONE HCL 5 MG PO TABS
5.0000 mg | ORAL_TABLET | Freq: Three times a day (TID) | ORAL | 0 refills | Status: DC | PRN
Start: 2023-02-22 — End: 2023-12-17

## 2023-02-22 MED ORDER — ONDANSETRON HCL 4 MG PO TABS: 4.0000 mg | ORAL_TABLET | Freq: Three times a day (TID) | ORAL | 0 refills | Status: DC | PRN

## 2023-02-22 MED ORDER — MISOPROSTOL 200 MCG PO TABS: ORAL_TABLET | ORAL | 0 refills | Status: DC

## 2023-02-22 NOTE — Discharge Instructions (Signed)
-   Please return for weekly lab visits until we know your pregnancy hormone number (b-hCG) is zero  - In the meantime, it is VERY important that you do NOT attempt to conceive until your b-hCG is zero

## 2023-02-22 NOTE — MAU Note (Signed)
.  Cindy Pope is a 21 y.o. at [redacted]w[redacted]d here in MAU for follow up hcg.  Denies changes to her bleeding.  Reports bleeding is still light to moderate.  Denies pain today   Pain score: 0 Vitals:   02/22/23 1111 02/22/23 1112  BP:  122/74  Pulse:  96  Resp:  17  Temp:  98.6 F (37 C)  SpO2: 100%    Lab orders placed from triage:   hcg

## 2023-02-22 NOTE — MAU Provider Note (Signed)
History     CSN: [id number]  Arrival date and time: 02/22/23 1042   Event Date/Time   First Provider Initiated Contact with Patient 02/22/23 1129      Chief Complaint  Patient presents with   Follow-up   HPI Cindy Pope is a 22 y.o. G1P0 at [redacted]w[redacted]d who presents today for repeat b-hCG. She was initially seen on 10/10 for VB and abdominal cramping. At that time, she had U/S which showed questionable single IGS, no YS or FP visualized. She was discharged with return precautions. Given continued bleeding and worsening pain, she returned later that night for re-evaluation, at which point, a CBC was repeated and she was noted to have a slight decrease in HgB from 13.2 to 12.5. She reports passing a palm sized amount of tissue on 10/11 in the evening. She reports ongoing bleeding, uses adult underwear for bleeding and has just needed to change once a day. No longer passing large clots, reports bleeding now moderate amount. She denies any abdominal pain today. No fevers or chills.  Past Medical History:  Diagnosis Date   Allergy Sep. 11th 2023   Caffine, break into hives and sweats if i drink to much of it.   Anxiety    Depression    Drug-induced asthma 04/01/2022   Hx of migraines    Prediabetes     No past surgical history on file.  Family History  Problem Relation Age of Onset   Anxiety disorder Mother    Depression Mother    Obesity Mother    Stroke Mother    Hypertension Mother    Anxiety disorder Father    Depression Father    Obesity Father    Diabetes Paternal Uncle    Diabetes Maternal Grandmother    Obesity Maternal Grandmother    Hearing loss Maternal Grandfather    Obesity Maternal Grandfather    Cancer Maternal Grandfather    Diabetes Paternal Grandmother    Hearing loss Paternal Grandmother    Obesity Paternal Grandmother    Diabetes Paternal Grandfather    Hearing loss Paternal Grandfather    Obesity Paternal Grandfather     Social History   Tobacco  Use   Smoking status: Never   Smokeless tobacco: Never  Vaping Use   Vaping status: Former   Quit date: 12/10/2021  Substance Use Topics   Alcohol use: No   Drug use: Never    Allergies:  Allergies  Allergen Reactions   Caffeine Hives and Other (See Comments)    Medications Prior to Admission  Medication Sig Dispense Refill Last Dose   albuterol (VENTOLIN HFA) 108 (90 Base) MCG/ACT inhaler INHALE 2 PUFFS INTO THE LUNGS EVERY 6 HOURS AS NEEDED FOR WHEEZING OR SHORTNESS OF BREATH 8.5 g 2    FLUoxetine (PROZAC) 20 MG tablet Take 1 tablet (20 mg total) by mouth daily. 90 tablet 3    fluticasone (FLONASE) 50 MCG/ACT nasal spray Place 2 sprays into both nostrils daily. (Patient not taking: Reported on 02/19/2023) 16 g 0    metFORMIN (GLUCOPHAGE) 500 MG tablet Take by mouth.      omeprazole (PRILOSEC) 20 MG capsule Take 20 mg by mouth daily. (Patient not taking: Reported on 02/19/2023)      propranolol (INDERAL) 10 MG tablet Take 10 mg by mouth 3 (three) times daily.      ROS reviewed and pertinent positives and negatives as documented in HPI.  Physical Exam   Blood pressure 122/74, pulse 96, temperature 98.6 F (  37 C), temperature source Oral, resp. rate 17, last menstrual period 12/13/2022, SpO2 100%.  Physical Exam Constitutional:      General: She is not in acute distress.    Appearance: Normal appearance. She is not ill-appearing.  HENT:     Head: Normocephalic and atraumatic.  Cardiovascular:     Rate and Rhythm: Normal rate.  Pulmonary:     Effort: Pulmonary effort is normal.     Breath sounds: Normal breath sounds.  Abdominal:     Palpations: Abdomen is soft.     Tenderness: There is no abdominal tenderness. There is no guarding.  Musculoskeletal:        General: Normal range of motion.  Skin:    General: Skin is warm and dry.     Findings: No rash.  Neurological:     General: No focal deficit present.     Mental Status: She is alert and oriented to person, place,  and time.     MAU Course  Procedures  MDM 22 y.o. G1P0 at [redacted]w[redacted]d who presents for repeat quant in setting of threatened Ab. She reports passing tissue two days ago and has a picture of it with her -- appears to be POC. Will repeat quant today to re-eval. Her exam and vitals are reassuring.  1407 hCG reviewed with pt -- significant drop from 16898 --> 1750. Discussed diagnosis of SAB with patient in detail today. We reviewed options for expectant management and likelihood that pt has already passed most of the POC. She expressed desire to expedite process as much as possible and move forward as this has been a traumatic process for her -- we discussed options for medication vs surgical management. She expressed strong desire to proceed with medication management. Will Rx Cytotec. Appt made for 1wk b-hCG recheck. She was advised to avoid attempts at conception during this time period -- she and her mother report this pregnancy was unplanned, no current plans to attempt to conceive. Pt stable for d/c at this time. Return precautions given.  Assessment and Plan  Spontaneous abortion - Plan: Discharge patient Rx for Cytotec sent after discussion with patient Need weekly hCG until quant is 0 Stable for d/c  Sundra Aland, MD OB Fellow, Faculty Practice [facility], [facility]  02/22/2023, 12:45 PM

## 2023-02-28 ENCOUNTER — Other Ambulatory Visit: Payer: Self-pay | Admitting: Internal Medicine

## 2023-02-28 DIAGNOSIS — F32A Depression, unspecified: Secondary | ICD-10-CM

## 2023-03-02 ENCOUNTER — Encounter: Payer: Self-pay | Admitting: *Deleted

## 2023-03-02 ENCOUNTER — Telehealth: Payer: Self-pay | Admitting: *Deleted

## 2023-03-02 ENCOUNTER — Ambulatory Visit: Payer: Managed Care, Other (non HMO)

## 2023-03-02 DIAGNOSIS — O039 Complete or unspecified spontaneous abortion without complication: Secondary | ICD-10-CM

## 2023-03-02 NOTE — Telephone Encounter (Signed)
Tanica DNKA stat bhcg. Upon chart review needs non stat bhcg, not stat bhcg. I called patient and left message notifying her she missed a scheduled appointment and needs to call to reschedule. I will also send MyChart message.  Nancy Fetter

## 2023-03-09 ENCOUNTER — Other Ambulatory Visit: Payer: Managed Care, Other (non HMO)

## 2023-03-16 ENCOUNTER — Telehealth: Payer: Self-pay

## 2023-03-16 NOTE — Telephone Encounter (Signed)
Alert received that MyChart message has not been read:   Cindy Pope, First let me say I am sorry for your loss. Second, I wanted to let you know you missed a scheduled appointment. The doctor recommends weekly lab draws until the hormone level returns to normal. Please send a message back for which day you prefer and morning or afternoon or call the office 1p-4pm today or tomorrow 8am-12 noon, or 1pm-4pm at [phone].  Linda,RN  Called pt; VM left stating I am calling to schedule follow up appointment and requested call back to the office.

## 2023-03-18 ENCOUNTER — Other Ambulatory Visit: Payer: Self-pay | Admitting: Internal Medicine

## 2023-03-18 DIAGNOSIS — E669 Obesity, unspecified: Secondary | ICD-10-CM

## 2023-03-19 NOTE — Telephone Encounter (Signed)
Not sure if patient is still interested in this medication. Called and left vm for patient to call me back and let me know if she still wants this medication.

## 2023-03-22 ENCOUNTER — Other Ambulatory Visit: Payer: Self-pay | Admitting: Internal Medicine

## 2023-03-22 DIAGNOSIS — E669 Obesity, unspecified: Secondary | ICD-10-CM

## 2023-05-01 ENCOUNTER — Other Ambulatory Visit: Payer: Self-pay | Admitting: Family Medicine

## 2023-05-04 ENCOUNTER — Other Ambulatory Visit: Payer: Self-pay | Admitting: Physician Assistant

## 2023-06-12 ENCOUNTER — Other Ambulatory Visit: Payer: Self-pay | Admitting: Internal Medicine

## 2023-06-12 DIAGNOSIS — F32A Depression, unspecified: Secondary | ICD-10-CM

## 2023-06-15 ENCOUNTER — Ambulatory Visit: Payer: Managed Care, Other (non HMO) | Admitting: Internal Medicine

## 2023-06-16 ENCOUNTER — Ambulatory Visit: Payer: Managed Care, Other (non HMO) | Admitting: Physician Assistant

## 2023-06-19 ENCOUNTER — Other Ambulatory Visit (HOSPITAL_COMMUNITY): Payer: Self-pay

## 2023-06-24 ENCOUNTER — Other Ambulatory Visit (HOSPITAL_COMMUNITY): Payer: Self-pay

## 2023-06-24 ENCOUNTER — Telehealth: Payer: Self-pay

## 2023-06-24 NOTE — Telephone Encounter (Signed)
Pharmacy Patient Advocate Encounter   Received notification from Patient Pharmacy that prior authorization for [facility] 0.25mg /0.32ml is required/requested.   Insurance verification completed.   The patient is insured through [employer] .   Per test claim: PA required; PA submitted to above mentioned insurance via CoverMyMeds Key/confirmation #/EOC [id number] Status is pending

## 2023-06-29 NOTE — Telephone Encounter (Signed)
 Pharmacy Patient Advocate Encounter  Received notification from [employer] that Prior Authorization for [facility] 0.25mg /0.103ml has been APPROVED from 06/24/23 to 01/25/24   PA #/Case ID/Reference #: [id number]

## 2023-06-30 NOTE — Telephone Encounter (Signed)
 Noted

## 2023-10-19 ENCOUNTER — Other Ambulatory Visit: Payer: Self-pay | Admitting: Physician Assistant

## 2023-10-19 DIAGNOSIS — F419 Anxiety disorder, unspecified: Secondary | ICD-10-CM

## 2023-10-21 ENCOUNTER — Other Ambulatory Visit: Payer: Self-pay | Admitting: Internal Medicine

## 2023-10-21 DIAGNOSIS — E669 Obesity, unspecified: Secondary | ICD-10-CM

## 2023-12-17 ENCOUNTER — Telehealth: Admitting: Physician Assistant

## 2023-12-17 VITALS — BP 135/83 | Ht 61.0 in | Wt 193.0 lb

## 2023-12-17 DIAGNOSIS — F419 Anxiety disorder, unspecified: Secondary | ICD-10-CM

## 2023-12-17 DIAGNOSIS — R030 Elevated blood-pressure reading, without diagnosis of hypertension: Secondary | ICD-10-CM

## 2023-12-17 DIAGNOSIS — F32A Depression, unspecified: Secondary | ICD-10-CM | POA: Diagnosis not present

## 2023-12-17 MED ORDER — PROPRANOLOL HCL 10 MG PO TABS: 10.0000 mg | ORAL_TABLET | Freq: Three times a day (TID) | ORAL | 2 refills | Status: AC | PRN

## 2023-12-17 MED ORDER — FLUOXETINE HCL 20 MG PO TABS: 20.0000 mg | ORAL_TABLET | Freq: Every day | ORAL | 1 refills | Status: AC

## 2023-12-17 NOTE — Progress Notes (Signed)
 Virtual Visit via Video Note  I connected with  Cindy Pope  on 12/17/23 at  8:30 AM EDT by a video enabled telemedicine application and verified that I am speaking with the correct person using two identifiers.  Location: Patient: home Provider: Nature conservation officer at [employer] Persons present: Patient and myself   I discussed the limitations of evaluation and management by telemedicine and the availability of in person appointments. The patient expressed understanding and agreed to proceed.   History of Present Illness:  Discussed the use of AI scribe software for clinical note transcription with the patient, who gave verbal consent to proceed.  History of Present Illness Cindy Pope is a 23 year old female who presents with increased anxiety and depression symptoms following a miscarriage.  She experienced a miscarriage in the fall of last year, which led to a significant increase in anxiety and depression symptoms. Prior to the miscarriage, she felt she was making progress with her anxiety. She attends counseling sessions at her church, which she finds helpful for discussing her experiences.  She is currently taking Prozac  20 mg daily, which she feels is effective in managing her symptoms. She experiences occasional 'fidgety moments' and excessive sweating, which she describes as her version of panic, but not full-blown panic attacks. She uses propranolol  10 mg as needed for severe anxiety, approximately once every two to three months, and finds it effective. She has also been prescribed hydroxyzine  for sleep in the past.  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 She has been gradually re-engaging in social activities, supported by her partner, who encourages her to go out. Difficult conversations can trigger feelings of panic, and she tends to avoid discussing traumatic events to prevent feeling overwhelmed.  Her family  history includes high blood pressure on her mother's side. She has been monitoring her blood pressure at home, noting a recent reading of 135/83 mmHg on her right arm.     Observations/Objective:   Gen: Awake, alert, no acute distress Resp: Breathing is even and non-labored Psych: calm/pleasant demeanor Neuro: Alert and Oriented x 3, + facial symmetry, speech is clear.   Assessment and Plan:  Assessment and Plan Assessment & Plan Depression and anxiety Increased anxiety and depression following a recent miscarriage. Currently managed with Prozac  20 mg daily, which has been effective. Occasional episodes of fidgetiness and sweating, but no full panic attacks. Propranolol  10 mg used as needed for acute anxiety, effective in the past. - Continue Prozac  20 mg daily. - Prescribe propranolol  10 mg, 30 tablets with refills, to be used as needed up to three times a day for acute anxiety. - Discussed the option to take propranolol  30 minutes before anticipated anxiety-inducing situations. - Educate on the ability to double the propranolol  dose if needed without risk.  Recent miscarriage Recent miscarriage has contributed to increased anxiety and depression. She is engaging in counseling through her church, which has been beneficial. - Encourage continued counseling support.  Elevated blood pressure, monitoring Blood pressure reading of 135/83 mmHg at home. Family history of hypertension. Possible contributing factors include stress, diet, and lifestyle. No history of chronic hypertension. - Monitor blood pressure at home once or twice a day for the next few weeks. - Advise on lifestyle modifications including reducing salt intake, increasing physical activity, and monitoring stress levels. - Instruct to contact the office if blood pressure remains elevated for further evaluation.    Follow Up  Instructions:    I discussed the assessment and treatment plan with the patient. The patient  was provided an opportunity to ask questions and all were answered. The patient agreed with the plan and demonstrated an understanding of the instructions.   The patient was advised to call back or seek an in-person evaluation if the symptoms worsen or if the condition fails to improve as anticipated.  Cindy Fazzino M Annalaura Sauseda, PA-C

## 2024-01-30 ENCOUNTER — Other Ambulatory Visit: Payer: Self-pay | Admitting: Internal Medicine

## 2024-01-30 DIAGNOSIS — J45909 Unspecified asthma, uncomplicated: Secondary | ICD-10-CM

## 2024-04-05 ENCOUNTER — Telehealth: Admitting: Physician Assistant

## 2024-04-05 DIAGNOSIS — R42 Dizziness and giddiness: Secondary | ICD-10-CM

## 2024-04-05 NOTE — Progress Notes (Signed)
  Because of the severity of the dizziness, I feel your condition warrants further evaluation and I recommend that you be seen in a face-to-face visit.   NOTE: There will be NO CHARGE for this E-Visit   If you are having a true medical emergency, please call 911.     For an urgent face to face visit, [place] has multiple urgent care centers for your convenience.  Click the link below for the full list of locations and hours, walk-in wait times, appointment scheduling options and driving directions:  Urgent Care - [place], [place], [place], [place], [place], [place]       Your MyChart E-visit questionnaire answers were reviewed by a board certified advanced clinical practitioner to complete your personal care plan based on your specific symptoms.    Thank you for using e-Visits.

## 2024-04-25 ENCOUNTER — Encounter: Admitting: Physician Assistant

## 2024-04-29 ENCOUNTER — Encounter: Payer: Self-pay | Admitting: Physician Assistant
